# Patient Record
Sex: Male | Born: 1940
Health system: Southern US, Community
[De-identification: ages and names within clinical notes are randomized; demographics above are authoritative.]

## PROBLEM LIST (undated history)

## (undated) DIAGNOSIS — I639 Cerebral infarction, unspecified: Secondary | ICD-10-CM

## (undated) DIAGNOSIS — R251 Tremor, unspecified: Secondary | ICD-10-CM

## (undated) DIAGNOSIS — H3530 Unspecified macular degeneration: Secondary | ICD-10-CM

## (undated) DIAGNOSIS — R269 Unspecified abnormalities of gait and mobility: Secondary | ICD-10-CM

## (undated) DIAGNOSIS — R5383 Other fatigue: Secondary | ICD-10-CM

## (undated) DIAGNOSIS — I10 Essential (primary) hypertension: Secondary | ICD-10-CM

## (undated) DIAGNOSIS — M1990 Unspecified osteoarthritis, unspecified site: Secondary | ICD-10-CM

## (undated) DIAGNOSIS — C801 Malignant (primary) neoplasm, unspecified: Secondary | ICD-10-CM

## (undated) DIAGNOSIS — C61 Malignant neoplasm of prostate: Secondary | ICD-10-CM

## (undated) DIAGNOSIS — E119 Type 2 diabetes mellitus without complications: Secondary | ICD-10-CM

## (undated) DIAGNOSIS — E559 Vitamin D deficiency, unspecified: Secondary | ICD-10-CM

## (undated) DIAGNOSIS — K219 Gastro-esophageal reflux disease without esophagitis: Secondary | ICD-10-CM

## (undated) DIAGNOSIS — G20A1 Parkinson's disease without dyskinesia, without mention of fluctuations: Secondary | ICD-10-CM

## (undated) DIAGNOSIS — I219 Acute myocardial infarction, unspecified: Secondary | ICD-10-CM

## (undated) HISTORY — DX: Unspecified abnormalities of gait and mobility: R26.9

## (undated) HISTORY — PX: HEMORROIDECTOMY: SUR656

## (undated) HISTORY — PX: CARDIAC SURGERY: SHX584

## (undated) HISTORY — DX: Vitamin D deficiency, unspecified: E55.9

## (undated) HISTORY — DX: Tremor, unspecified: R25.1

## (undated) HISTORY — PX: KNEE SURGERY: SHX244

## (undated) HISTORY — DX: Other fatigue: R53.83

## (undated) HISTORY — PX: CORONARY ARTERY BYPASS GRAFT: SHX141

## (undated) HISTORY — DX: Malignant neoplasm of prostate: C61

---

## 1998-04-23 ENCOUNTER — Inpatient Hospital Stay (HOSPITAL_COMMUNITY)
Admission: AD | Admit: 1998-04-23 | Discharge: 1998-04-28 | Payer: Self-pay | Admitting: Thoracic Surgery (Cardiothoracic Vascular Surgery)

## 2004-05-12 ENCOUNTER — Ambulatory Visit (HOSPITAL_COMMUNITY): Admission: RE | Admit: 2004-05-12 | Discharge: 2004-05-12 | Payer: Self-pay | Admitting: General Surgery

## 2005-08-20 ENCOUNTER — Ambulatory Visit: Payer: Self-pay | Admitting: Orthopedic Surgery

## 2005-08-28 ENCOUNTER — Ambulatory Visit: Payer: Self-pay | Admitting: Orthopedic Surgery

## 2005-08-28 ENCOUNTER — Ambulatory Visit (HOSPITAL_COMMUNITY): Admission: RE | Admit: 2005-08-28 | Discharge: 2005-08-28 | Payer: Self-pay | Admitting: Orthopedic Surgery

## 2005-08-31 ENCOUNTER — Ambulatory Visit: Payer: Self-pay | Admitting: Orthopedic Surgery

## 2005-09-01 ENCOUNTER — Encounter (HOSPITAL_COMMUNITY): Admission: RE | Admit: 2005-09-01 | Discharge: 2005-09-16 | Payer: Self-pay | Admitting: Orthopedic Surgery

## 2005-09-23 ENCOUNTER — Ambulatory Visit: Payer: Self-pay | Admitting: Orthopedic Surgery

## 2007-04-27 ENCOUNTER — Ambulatory Visit (HOSPITAL_COMMUNITY): Admission: RE | Admit: 2007-04-27 | Discharge: 2007-04-27 | Payer: Self-pay | Admitting: General Surgery

## 2009-12-25 ENCOUNTER — Ambulatory Visit
Admission: RE | Admit: 2009-12-25 | Discharge: 2010-03-25 | Payer: Self-pay | Source: Home / Self Care | Admitting: Radiation Oncology

## 2010-02-28 ENCOUNTER — Encounter: Admission: RE | Admit: 2010-02-28 | Discharge: 2010-02-28 | Payer: Self-pay | Admitting: Urology

## 2010-04-08 ENCOUNTER — Ambulatory Visit (HOSPITAL_BASED_OUTPATIENT_CLINIC_OR_DEPARTMENT_OTHER): Admission: RE | Admit: 2010-04-08 | Discharge: 2010-04-08 | Payer: Self-pay | Admitting: Urology

## 2010-05-02 ENCOUNTER — Ambulatory Visit: Admission: RE | Admit: 2010-05-02 | Discharge: 2010-05-29 | Payer: Self-pay | Admitting: Radiation Oncology

## 2010-12-07 LAB — CBC
Hemoglobin: 13.2 g/dL (ref 13.0–17.0)
MCV: 89.7 fL (ref 78.0–100.0)
Platelets: 246 10*3/uL (ref 150–400)
RBC: 4.22 MIL/uL (ref 4.22–5.81)
RDW: 15.9 % — ABNORMAL HIGH (ref 11.5–15.5)
WBC: 4.7 10*3/uL (ref 4.0–10.5)

## 2010-12-07 LAB — COMPREHENSIVE METABOLIC PANEL
BUN: 16 mg/dL (ref 6–23)
Calcium: 9.4 mg/dL (ref 8.4–10.5)
Chloride: 107 mEq/L (ref 96–112)
Creatinine, Ser: 0.89 mg/dL (ref 0.4–1.5)
GFR calc Af Amer: >60 mL/min (ref >60)
GFR calc non Af Amer: >60 mL/min (ref >60)
Potassium: 4.2 mEq/L (ref 3.5–5.1)
Sodium: 139 mEq/L (ref 135–145)
Total Bilirubin: 0.8 mg/dL (ref 0.3–1.2)

## 2010-12-07 LAB — APTT: aPTT: 31 seconds (ref 24–37)

## 2010-12-07 LAB — PROTIME-INR: Prothrombin Time: 13.9 seconds (ref 11.6–15.2)

## 2010-12-25 ENCOUNTER — Other Ambulatory Visit: Payer: Self-pay | Admitting: General Surgery

## 2010-12-25 ENCOUNTER — Encounter (HOSPITAL_COMMUNITY): Payer: Medicare Other

## 2010-12-25 LAB — BASIC METABOLIC PANEL
BUN: 13 mg/dL (ref 6–23)
CO2: 27 mEq/L (ref 19–32)
GFR calc Af Amer: >60 mL/min (ref >60)
Glucose, Bld: 174 mg/dL — ABNORMAL HIGH (ref 70–99)

## 2010-12-25 LAB — SURGICAL PCR SCREEN: MRSA, PCR: NEGATIVE

## 2010-12-25 LAB — CBC
Hemoglobin: 13.8 g/dL (ref 13.0–17.0)
MCH: 31.3 pg (ref 26.0–34.0)
MCHC: 34.1 g/dL (ref 30.0–36.0)
Platelets: 232 10*3/uL (ref 150–400)

## 2010-12-29 ENCOUNTER — Ambulatory Visit (HOSPITAL_COMMUNITY)
Admission: RE | Admit: 2010-12-29 | Discharge: 2010-12-29 | Disposition: A | Discharge status: Home / Self Care | Payer: Medicare Other | Source: Ambulatory Visit | Attending: General Surgery | Admitting: General Surgery

## 2010-12-29 ENCOUNTER — Other Ambulatory Visit: Payer: Self-pay | Admitting: General Surgery

## 2010-12-29 DIAGNOSIS — K602 Anal fissure, unspecified: Secondary | ICD-10-CM | POA: Insufficient documentation

## 2010-12-29 DIAGNOSIS — K644 Residual hemorrhoidal skin tags: Secondary | ICD-10-CM | POA: Insufficient documentation

## 2010-12-29 DIAGNOSIS — I10 Essential (primary) hypertension: Secondary | ICD-10-CM | POA: Insufficient documentation

## 2010-12-29 DIAGNOSIS — Z7982 Long term (current) use of aspirin: Secondary | ICD-10-CM | POA: Insufficient documentation

## 2010-12-29 DIAGNOSIS — Z79899 Other long term (current) drug therapy: Secondary | ICD-10-CM | POA: Insufficient documentation

## 2010-12-29 DIAGNOSIS — K648 Other hemorrhoids: Secondary | ICD-10-CM | POA: Insufficient documentation

## 2011-01-01 NOTE — H&P (Signed)
  [name], [name]               ACCOUNT NO.:  [id number]  MEDICAL RECORD NO.:  [id number]          PATIENT TYPE:  LOCATION:                                 FACILITY:  PHYSICIAN:  Dalia Heading, M.D.  DATE OF BIRTH:  [birth year]  DATE OF ADMISSION: DATE OF DISCHARGE:  LH                             HISTORY & PHYSICAL   CHIEF COMPLAINT:  Rectal pain.  HISTORY OF PRESENT ILLNESS:  The patient is a 70 year old white male, who is referred for evaluation and treatment of rectal pain.  He has been present intermittently for 1 year.  This occurred soon after having radiation and seed implantation for prostate cancer.  He has had recent increasing discomfort.  Suppositories have not been helpful.  He last had a colonoscopy approximately 10 years ago.  He denies any significant rectal bleeding.  Suppositories have not been helpful.  PAST MEDICAL HISTORY:  As noted above, coronary artery disease, hypertension.  PAST SURGICAL HISTORY:  Right knee surgery, CABG, radiation, seed implantation in July 2011.  CURRENT MEDICATIONS:  Pravastatin 20 mg p.o. daily, enalapril 20 mg p.o. daily, baby aspirin which he is holding.  ALLERGIES:  No known drug allergies.  REVIEW OF SYSTEMS:  The patient denies drinking or smoking.  He denies any other cardiopulmonary difficulties or bleeding disorders.  FAMILY MEDICAL HISTORY:  Noncontributory.  PHYSICAL EXAMINATION:  GENERAL:  The patient is a well-developed, well- nourished white male, in no acute distress. HEENT:  Unremarkable. LUNGS:  Clear to auscultation with equal breath sounds bilaterally. HEART:  Reveals regular rate and rhythm without S3, S4, or murmurs. ABDOMEN:  Soft, nontender, nondistended.  No hepatosplenomegaly, masses, hernias identified. RECTAL:  Reveals mild internal hemorrhoidal disease, but no external hemorrhoids noted, and fissure is noted anteriorly.  A tight internal sphincter is noted.  IMPRESSION:  Anal  fissure.  PLAN:  The patient was scheduled for an anal fissurectomy with sphincterotomy on December 29, 2010.  Risks and benefits of the procedure including bleeding, recurrence, and incontinence were fully explained to the patient, gave informed consent.     Dalia Heading, M.D.     MAJ/MEDQ  D:  12/25/2010  T:  12/26/2010  Job:  [id number]  cc:   Corrie Mckusick, M.D. Fax: [phone]  Electronically Signed by Franky Macho M.D. on 01/01/2011 07:54:05 AM

## 2011-01-01 NOTE — Op Note (Signed)
  NAMEJESSEN, [name]               ACCOUNT NO.:  [id number]  MEDICAL RECORD NO.:  [id number]           PATIENT TYPE:  O  LOCATION:  DAYP                          FACILITY:  [facility]  PHYSICIAN:  Dalia Heading, M.D.  DATE OF BIRTH:  09/06/1941  DATE OF PROCEDURE:  12/29/2010 DATE OF DISCHARGE:                              OPERATIVE REPORT   PREOPERATIVE DIAGNOSIS:  Anal fissure.  POSTOPERATIVE DIAGNOSES:  Anal fissure, extensive internal and external hemorrhoids.  PROCEDURE:  Extensive hemorrhoidectomy.  SURGEON:  Dalia Heading, MD  ANESTHESIA:  General.  INDICATIONS:  The patient is a 70 year old white male who presents with rectal pain and a possible anterior anal fissure.  He has had radiation seed implanted in the last year for prostate cancer.  The risks and benefits of the procedure including bleeding, infection, recurrence of the pain and hemorrhoids were fully explained to the patient, gave informed consent.  PROCEDURE NOTE:  The patient was placed in lithotomy position after general anesthesia was administered.  The perineum was prepped and draped using the usual sterile technique with Betadine.  Surgical site confirmation was performed.  On anoscopy, the patient had minimal tear in the anterior portion of the rectum.  This was fulgurated without difficulty.  The patient had extensive internal hemorrhoids extending to the external hemorrhoidal region along the 8 o'clock position.  Minimal external hemorrhoids were noted at the 4 o'clock position.  Both internal hemorrhoids were noted at the 4 o'clock and 8 o'clock positions.  The internal hemorrhoid at the 4 o'clock position was removed using the LigaSure.  The external and internal hemorrhoids at the 8 o'clock position were removed using the LigaSure.  At the anal verge at the 8 o'clock position, several 2-0 Vicryl sutures were placed to help reapproximating the mucosa in this region.  There did not appear to be  a need for a sphincterotomy as the internal sphincter was well relaxed and allowed two fingers into the rectum.  0.5% Sensorcaine was instilled in the surrounding perineum. Surgicel and viscous Xylocaine packing were then placed.  All tape and needle counts were correct at the end of the procedure. The patient was awakened and transferred to the recovery room awake in stable condition.  COMPLICATIONS:  None.  SPECIMEN:  Hemorrhoids.  BLOOD LOSS:  Minimal.     Dalia Heading, M.D.     MAJ/MEDQ  D:  12/29/2010  T:  12/30/2010  Job:  [id number]  Electronically Signed by Franky Macho M.D. on 01/01/2011 07:54:03 AM

## 2011-02-06 NOTE — Procedures (Signed)
[name], [name]               ACCOUNT NO.:  [id number]   MEDICAL RECORD NO.:  [id number]          PATIENT TYPE:  AMB   LOCATION:  DAY                           FACILITY:  [facility]   PHYSICIAN:  Edward L. Juanetta Gosling, M.D.DATE OF BIRTH:  [birth year]   DATE OF PROCEDURE:  08/26/2005  DATE OF DISCHARGE:                                EKG INTERPRETATION   Time 10:24, August 26, 2005. The rhythm is sinus rhythm with a rate in the  60s. The R wave in V2 is much taller than the R wave in V3, and I suspect  there may have been transpositions of these leads. This could also have been  secondary to an anterior infarction, but there is excellent voltage in V4  and V5, and I suspect the most likely problem is transposition of leads V2  and V3. Otherwise normal electrocardiogram.      Edward L. Juanetta Gosling, M.D.  Electronically Signed     ELH/MEDQ  D:  08/26/2005  T:  08/27/2005  Job:  [id number]

## 2011-02-06 NOTE — H&P (Signed)
NAMECOOLIDGE, [name]               ACCOUNT NO.:  [id number]   MEDICAL RECORD NO.:  [id number]          PATIENT TYPE:  AMB   LOCATION:  DAY                           FACILITY:  [facility]   PHYSICIAN:  Vickki Hearing, M.D.DATE OF BIRTH:  [birth year]   DATE OF ADMISSION:  DATE OF DISCHARGE:  LH                                HISTORY & PHYSICAL   CHIEF COMPLAINT:  Right knee pain.   He is 70 years old. He has had pain on and off in his right knee for six to  eight months. He got one cortisone injection from Dr. Sherwood Gambler, and initially  that helped. He has continued to have pain, however, and eventually went for  a MRI at [facility]. He was shown to have advanced medial  compartment osteoarthritis, stress reaction of the medial condyle, and a  chronically torn and fragmented medial meniscus with effusion and Baker's  cyst due to mechanical symptoms. He presents for arthroscopic surgery of the  right knee.   Review of systems, past, family and social history show no allergies. He had  coronary artery bypass surgery. He takes Lipitor and aspirin. Has coronary  artery disease. Family history of cancer. He is married. He is a Glass blower/designer. Does not smoke or drink. Caffeine used is only a morning cup of  coffee, and his grade level is grade 12.   REVIEW OF SYSTEMS:  Normal for all 10 systems.   PHYSICAL EXAMINATION:  VITAL SIGNS:  Weight 200, pulse 74, respiratory rate  18.  GENERAL APPEARANCE:  Normal development, grooming, hygiene and nutrition.  PSYCH:  Alert and oriented x3. Mood normal.  NEUROLOGICAL:  Sensation normal. Coordination normal. Reflexes normal.  LYMPH NODES:  Normal.  SKIN:  Normal.  CARDIOVASCULAR/PERIPHERAL VASCULAR:  Good pulse and perfusion. No edema and  no swelling. No venous stasis.  MUSCULOSKELETAL:  Gait and station showed fairly normal gait pattern. His  right lower extremity range of motion was limited I think secondary to  arthritis. Range of  motion was approximately 5 to 125 degrees. He had normal  muscle strength and tone with no atrophy or tremor. There was good stability  in all the ligaments. His medial joint line was tender including the medial  femoral condyle and medial tibial plateau, and he did have meniscal signs.  His opposite extremity was unremarkable. His upper extremities had normal  alignment. No contraction, atrophy or subluxation.   STUDIES:  MRI shows the findings as I stated.   DIAGNOSIS:  Osteoarthritis and torn medial meniscus, right knee.   PLAN:  Arthroscopic medial meniscectomy and evaluation of the joint. The  patient has consented to this surgery.      Vickki Hearing, M.D.  Electronically Signed     SEH/MEDQ  D:  08/27/2005  T:  08/27/2005  Job:  [id number]   cc:   Madelin Rear. Sherwood Gambler, MD  Fax: [phone]

## 2011-02-06 NOTE — H&P (Signed)
NAME:  Luiscarlos, [name] NO.:  [id number]   MEDICAL RECORD NO.:  [id number]                   PATIENT TYPE:   LOCATION:                                       FACILITY:   PHYSICIAN:  Dalia Heading, M.D.               DATE OF BIRTH:  [birth year]   DATE OF ADMISSION:  DATE OF DISCHARGE:                                HISTORY & PHYSICAL   CHIEF COMPLAINT:  Need for screening colonoscopy.   HISTORY OF PRESENT ILLNESS:  The patient is a 70 year old white male who is  referred for endoscopic evaluation.  He needs a colonoscopy for screening  purposes.  No abdominal pain, weight loss, nausea, vomiting, diarrhea,  constipation, melena, or hematochezia have been noted.  He has never had a  colonoscopy.  There is no family history of colon carcinoma.   PAST MEDICAL HISTORY:  Coronary artery disease.   PAST SURGICAL HISTORY:  CABG in 1999.   CURRENT MEDICATIONS:  Baby aspirin.   ALLERGIES:  No known drug allergies.   REVIEW OF SYSTEMS:  Noncontributory.   PHYSICAL EXAMINATION:  GENERAL:  The patient is a well-developed, well-  nourished white male in no acute distress.  VITAL SIGNS:  He is afebrile and vital signs are stable.  LUNGS:  Clear to auscultation with equal breath sounds bilaterally.  HEART:  Regular rate and rhythm without S3, S4, or murmurs.  ABDOMEN:  Soft, nontender, nondistended.  No hepatosplenomegaly or masses  are noted.  RECTAL:  Deferred to the procedure.   IMPRESSION:  Need for screening colonoscopy.   PLAN:  The patient is scheduled for a colonoscopy on May 12, 2004.  The  risks and benefits of the procedure, including bleeding and perforation were  fully explained to the patient, who gave informed consent.     ___________________________________________                                         Dalia Heading, M.D.   MAJ/MEDQ  D:  05/01/2004  T:  05/01/2004  Job:  [id number]   cc:   Madelin Rear. Sherwood Gambler, M.D.  [address]  [place]  [place] [address]  Fax: [phone]

## 2011-02-06 NOTE — Op Note (Signed)
NAMEHEATON, SARIN               ACCOUNT NO.:  [id number]   MEDICAL RECORD NO.:  [id number]          PATIENT TYPE:  AMB   LOCATION:  DAY                           FACILITY:  [facility]   PHYSICIAN:  Vickki Hearing, M.D.DATE OF BIRTH:  21-May-1941   DATE OF PROCEDURE:  08/28/2005  DATE OF DISCHARGE:                                 OPERATIVE REPORT   PREOPERATIVE DIAGNOSIS:  Torn medial meniscus and osteoarthritis of the  right knee.   POSTOPERATIVE DIAGNOSIS:  Torn medial meniscus and osteoarthritis of the  right knee.   PROCEDURE:  Arthroscopy right knee, medial meniscectomy, medial femoral  condyle chondroplasty.   SURGEON:  Dr. [place] Apple. No assistants.   ANESTHETIC:  General.   SPECIMENS:  No specimens.   BLOOD LOSS:  Minimal.   COMPLICATIONS:  None. Counts were correct at end of procedure. The patient  went to PACU in good condition.   Mr. [name] was identified in the preoperative holding area. His right  knee was marked for surgery, countersigned by the surgeon. History and  physical was updated. Antibiotics were started. He was taken to the  operating room. General anesthesia was administered in the supine position.  His right leg was prepped and draped using sterile technique using DuraPrep.   A time-out was taken. Procedure was confirmed. The knee was entered through  a lateral portal. A diagnostic arthroscopy was done arthroscopic findings  included the following:  There was generalized arthritis of the medial  compartment over the medial femoral condyle with a grade 2 chondral lesion  in terms of depth, grade 3 in terms of overall width. There was a torn  medial meniscus at its posterior horn.   Remaining structures of the knee were normal, except for the mild  chondromalacia of patella, grade 1.   Using a combination of arthroscopic instruments, duckbill shaver and  arthroscopic ArthroCare wand, the meniscal tear was resected, balanced and  the  meniscal fragments were removed from the knee via suction. Knee was  washed and irrigated and then closed with half inch Steri-Strips and  injected with 20 cc of Marcaine with epinephrine.   Sterile dressings were applied and a CryoCuff was added. The patient was  extubated and taken to recovery room in stable condition.   POSTOPERATIVE PLAN:  Physical therapy on Tuesday. He can be discharged to  home and follow with me on Monday. He has Lorcet Plus 1 every 4 hours #60  with 2 refills. He is given some Phenergan 25 mg every 6 hours p.r.n. nausea  #30 with 1 refill.      Vickki Hearing, M.D.  Electronically Signed     SEH/MEDQ  D:  08/28/2005  T:  08/28/2005  Job:  [id number]

## 2011-08-25 ENCOUNTER — Encounter: Payer: Self-pay | Admitting: *Deleted

## 2011-08-25 NOTE — Progress Notes (Signed)
IPPIS results on 12/25/09= 0,2,0,0,0,0,1=3

## 2016-02-22 NOTE — Progress Notes (Signed)
Patient ID: [name], male   DOB: 12/21/1940, 75 y.o.   MRN: [id number]     Cardiology Office Note   Date:  02/25/2016   ID:  [name], DOB Nov 11, 1940, MRN [id number]  PCP:  Glo Herring., MD  Cardiologist:   Jenkins Rouge, MD   No chief complaint on file.     History of Present Illness: [name] is a 75 y.o. male who presents for post hospital f/u.  Admitted [facility] 5/2-01/24/16  Acute Bronchitis , Dyspnea ? CHF and abnormal ECG.   CTA with no PE prior granulomatours disease.  Sent  Home on prednisone taper.  CRF;s HTN and elevated lipids Had MI in [place] to [place].  Cath Dr Ubaldo Glassing and transferred to [facility] for CABG with Dr Servando Snare.  Details of surgery not available in Epic.  Saw Dr Ubaldo Glassing For a year or two post CABG but then just f/u with Dr Gerarda Fraction.  Since being hospitalized still with more exertional dyspnea.  No chest pain.  No edema palpitations or syncope. Compliant with meds. 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 Spends winters in [place]. Living in motor home [place] now.      No past medical history on file.  No past surgical history on file.   Current Outpatient Prescriptions  Medication Sig Dispense Refill  . amLODipine (NORVASC) 5 MG tablet Take 5 mg by mouth daily.    Marland Kitchen aspirin EC 81 MG tablet Take 81 mg by mouth daily.    . enalapril (VASOTEC) 20 MG tablet Take 20 mg by mouth daily.    . pravastatin (PRAVACHOL) 20 MG tablet Take 20 mg by mouth daily.    . metFORMIN (GLUCOPHAGE-XR) 500 MG 24 hr tablet Take 500 mg by mouth daily with breakfast. 2 tabs daily     No current facility-administered medications for this visit.    Allergies:   Review of patient's allergies indicates no known allergies.    Social History:  The patient  reports that he quit smoking about 57 years ago. His smoking use included Cigarettes. He smoked 1.00 pack per day. He has  quit using smokeless tobacco. His smokeless tobacco use included Snuff.   Family History:  The patient's family history is not on file.    ROS:  Please see the history of present illness.   Otherwise, review of systems are positive for none.   All other systems are reviewed and negative.    PHYSICAL EXAM: VS:  BP 124/78 mmHg  Pulse 95  Ht 5\' 9"  (1.753 m)  Wt 87.544 kg (193 lb)  BMI 28.49 kg/m2  SpO2 99% , BMI Body mass index is 28.49 kg/(m^2). Affect appropriate Healthy:  appears stated age 75: normal Neck supple with no adenopathy JVP normal no bruits no thyromegaly Lungs clear with no wheezing and good diaphragmatic motion Heart:  S1/S2 no murmur, no rub, gallop or click previous sternotomy PMI normal Abdomen: benighn, BS positve, no tenderness, no AAA no bruit.  No HSM or HJR Distal pulses intact with no bruits No edema Neuro non-focal Skin warm and dry No muscular weakness    EKG:   02/25/16  NSR normal ECG    Recent Labs: No results found for requested labs within last 365 days.    Lipid Panel No results found for: CHOL, TRIG, HDL, CHOLHDL, VLDL, LDLCALC, LDLDIRECT    Wt Readings from Last  3 Encounters:  02/25/16 87.544 kg (193 lb)      Other studies Reviewed: Additional studies/ records that were reviewed today include: Dr Leamon Arnt records and notes from [facility] admission .    ASSESSMENT AND PLAN:  1.  CAD/CABG:  With more fatigue and exertional dypsnea. F/U exercise myovue given 75 yo grafts 2. Dysonea:  ? Related to recent URI/Bronchitis not wheezing done with steroid taper and nebulizer f/u echo to assess RV/LV function 3. BP:  Well controlled.  Continue current medications and low sodium Dash type diet.   4 Chol: on statin labs with primary  5. DM:  Discussed low carb diet.  Target hemoglobin A1c is 6.5 or less.  Continue current medications. Hold metfomin for myovue study    Current medicines are reviewed at length with the patient today.   The patient does not have concerns regarding medicines.  The following changes have been made:  no change  Labs/ tests ordered today include: Ex Myovue and Echo   Orders Placed This Encounter  Procedures  . EKG 12-Lead     Disposition:   FU with me in a year      Signed, Jenkins Rouge, MD  02/25/2016 2:35 PM    [facility] [place], [place], [place]  [address] Phone: [phone]; Fax: [phone]

## 2016-02-24 DIAGNOSIS — E119 Type 2 diabetes mellitus without complications: Secondary | ICD-10-CM | POA: Insufficient documentation

## 2016-02-24 DIAGNOSIS — E782 Mixed hyperlipidemia: Secondary | ICD-10-CM | POA: Insufficient documentation

## 2016-02-24 DIAGNOSIS — I10 Essential (primary) hypertension: Secondary | ICD-10-CM | POA: Insufficient documentation

## 2016-02-24 DIAGNOSIS — Z6829 Body mass index (BMI) 29.0-29.9, adult: Secondary | ICD-10-CM

## 2016-02-25 ENCOUNTER — Encounter: Payer: Self-pay | Admitting: Cardiovascular Disease

## 2016-02-25 ENCOUNTER — Ambulatory Visit (INDEPENDENT_AMBULATORY_CARE_PROVIDER_SITE_OTHER): Payer: Medicare Other | Admitting: Cardiovascular Disease

## 2016-02-25 VITALS — BP 124/78 | HR 95 | Ht 69.0 in | Wt 193.0 lb

## 2016-02-25 DIAGNOSIS — R072 Precordial pain: Secondary | ICD-10-CM

## 2016-02-25 DIAGNOSIS — R0600 Dyspnea, unspecified: Secondary | ICD-10-CM

## 2016-02-25 NOTE — Patient Instructions (Addendum)
Your physician wants you to follow-up in: 1 year with Dr Johnsie Cancel in [facility] will receive a reminder letter in the mail two months in advance. If you don't receive a letter, please call our office to schedule the follow-up appointment.    Your physician recommends that you continue on your current medications as directed. Please refer to the Current Medication list given to you today.   Your physician has requested that you have an echocardiogram. Echocardiography is a painless test that uses sound waves to create images of your heart. It provides your doctor with information about the size and shape of your heart and how well your heart's chambers and valves are working. This procedure takes approximately one hour. There are no restrictions for this procedure.   Your physician has requested that you have en exercise stress myoview. For further information please visit [email or web address]. Please follow instruction sheet, as given.HOLD METFORMIN AM OF TEST     If you need a refill on your cardiac medications before your next appointment, please call your pharmacy.      Thank you for choosing [facility] !

## 2016-03-05 ENCOUNTER — Encounter (HOSPITAL_COMMUNITY): Payer: Medicare Other

## 2016-03-05 ENCOUNTER — Encounter (HOSPITAL_COMMUNITY)
Admission: RE | Admit: 2016-03-05 | Discharge: 2016-03-05 | Disposition: A | Discharge status: Home / Self Care | Payer: Medicare Other | Source: Ambulatory Visit | Attending: Cardiovascular Disease | Admitting: Cardiovascular Disease

## 2016-03-05 ENCOUNTER — Encounter (HOSPITAL_COMMUNITY): Payer: Self-pay

## 2016-03-05 ENCOUNTER — Ambulatory Visit (HOSPITAL_COMMUNITY)
Admission: RE | Admit: 2016-03-05 | Discharge: 2016-03-05 | Disposition: A | Discharge status: Home / Self Care | Payer: Medicare Other | Source: Ambulatory Visit | Attending: Cardiovascular Disease | Admitting: Cardiovascular Disease

## 2016-03-05 ENCOUNTER — Inpatient Hospital Stay (HOSPITAL_COMMUNITY): Admission: RE | Admit: 2016-03-05 | Payer: Medicare Other | Source: Ambulatory Visit

## 2016-03-05 DIAGNOSIS — R0600 Dyspnea, unspecified: Secondary | ICD-10-CM | POA: Insufficient documentation

## 2016-03-05 DIAGNOSIS — Z951 Presence of aortocoronary bypass graft: Secondary | ICD-10-CM | POA: Insufficient documentation

## 2016-03-05 DIAGNOSIS — R072 Precordial pain: Secondary | ICD-10-CM | POA: Diagnosis not present

## 2016-03-05 DIAGNOSIS — E785 Hyperlipidemia, unspecified: Secondary | ICD-10-CM | POA: Diagnosis not present

## 2016-03-05 DIAGNOSIS — R079 Chest pain, unspecified: Secondary | ICD-10-CM

## 2016-03-05 DIAGNOSIS — I119 Hypertensive heart disease without heart failure: Secondary | ICD-10-CM | POA: Diagnosis not present

## 2016-03-05 DIAGNOSIS — E119 Type 2 diabetes mellitus without complications: Secondary | ICD-10-CM | POA: Insufficient documentation

## 2016-03-05 HISTORY — DX: Malignant (primary) neoplasm, unspecified: C80.1

## 2016-03-05 LAB — ECHOCARDIOGRAM COMPLETE
CHL CUP MV DEC (S): 433
CHL CUP STROKE VOLUME: 49 mL
CHL CUP TV REG PEAK VELOCITY: 227 cm/s
E decel time: 433 msec
EERAT: 5.72
FS: 32 % (ref 28–44)
IVS/LV PW RATIO, ED: 1.07
LA ID, A-P, ES: 45 mm
LA diam end sys: 45 mm
LA diam index: 2.22 cm/m2
LA vol A4C: 49.4 ml
LA vol: 58.4 mL
LAVOLIN: 28.8 mL/m2
LDCA: 4.52 cm2
LV E/e' medial: 5.72
LV E/e'average: 5.72
LV PW d: 11.8 mm — AB (ref 0.6–1.1)
LV SIMPSON'S DISK: 59
LV TDI E'MEDIAL: 5.66
LV dias vol: 83 mL (ref 62–150)
LV sys vol index: 17 mL/m2
LV sys vol: 34 mL (ref 21–61)
LVDIAVOLIN: 41 mL/m2
LVELAT: 10 cm/s
LVOTD: 24 mm
MV pk A vel: 114 m/s
MVPKEVEL: 57.2 m/s
TAPSE: 15.5 mm
TDI e' lateral: 10
TRMAXVEL: 227 cm/s

## 2016-03-05 LAB — NM MYOCAR MULTI W/SPECT W/WALL MOTION / EF
CHL CUP NUCLEAR SDS: 3
CHL CUP NUCLEAR SRS: 0
CHL CUP RESTING HR STRESS: 50 {beats}/min
CHL RATE OF PERCEIVED EXERTION: 14
CSEPEDS: 1 s
CSEPEW: 7 METS
CSEPPHR: 129 {beats}/min
Exercise duration (min): 5 min
LHR: 0.25
LV dias vol: 101 mL (ref 62–150)
LV sys vol: 45 mL
MPHR: 145 {beats}/min
Percent HR: 88 %
SSS: 3
TID: 0.89

## 2016-03-05 MED ORDER — TECHNETIUM TC 99M TETROFOSMIN IV KIT
10.0000 | PACK | Freq: Once | INTRAVENOUS | Status: AC | PRN
  Administered 2016-03-05: 11 via INTRAVENOUS

## 2016-03-05 MED ORDER — REGADENOSON 0.4 MG/5ML IV SOLN
INTRAVENOUS | Status: AC
  Filled 2016-03-05: qty 5

## 2016-03-05 MED ORDER — TECHNETIUM TC 99M TETROFOSMIN IV KIT
30.0000 | PACK | Freq: Once | INTRAVENOUS | Status: AC | PRN
  Administered 2016-03-05: 30 via INTRAVENOUS

## 2016-03-05 MED ORDER — SODIUM CHLORIDE 0.9% FLUSH
INTRAVENOUS | Status: AC
  Administered 2016-03-05: 10 mL via INTRAVENOUS
  Filled 2016-03-05: qty 10

## 2016-04-07 ENCOUNTER — Other Ambulatory Visit (HOSPITAL_COMMUNITY): Payer: Self-pay | Admitting: Respiratory Therapy

## 2016-04-07 DIAGNOSIS — R05 Cough: Secondary | ICD-10-CM

## 2016-04-07 DIAGNOSIS — R059 Cough, unspecified: Secondary | ICD-10-CM

## 2016-04-15 ENCOUNTER — Ambulatory Visit (HOSPITAL_COMMUNITY)
Admission: RE | Admit: 2016-04-15 | Discharge: 2016-04-15 | Disposition: A | Discharge status: Home / Self Care | Payer: Medicare Other | Source: Ambulatory Visit | Attending: Pulmonary Disease | Admitting: Pulmonary Disease

## 2016-04-15 DIAGNOSIS — R05 Cough: Secondary | ICD-10-CM | POA: Diagnosis present

## 2016-04-15 MED ORDER — ALBUTEROL SULFATE (2.5 MG/3ML) 0.083% IN NEBU
2.5000 mg | INHALATION_SOLUTION | Freq: Once | RESPIRATORY_TRACT | Status: AC
  Administered 2016-04-15: 2.5 mg via RESPIRATORY_TRACT

## 2016-04-17 LAB — PULMONARY FUNCTION TEST
DL/VA % PRED: 92 %
DL/VA: 4.2 ml/min/mmHg/L
DLCO UNC % PRED: 90 %
DLCO UNC: 28.11 ml/min/mmHg
FEF 25-75 POST: 3.58 L/s
FEF 25-75 PRE: 3.33 L/s
FEF2575-%CHANGE-POST: 7 %
FEF2575-%PRED-POST: 169 %
FEF2575-%Pred-Pre: 157 %
FEV1-%CHANGE-POST: 1 %
FEV1-%Pred-Post: 91 %
FEV1-%Pred-Pre: 90 %
FEV1-POST: 2.67 L
FEV1-Pre: 2.64 L
FEV1FVC-%Change-Post: -3 %
FEV1FVC-%PRED-PRE: 119 %
FEV6-%Change-Post: 5 %
FEV6-%PRED-POST: 84 %
FEV6-%Pred-Pre: 80 %
FEV6-POST: 3.22 L
FEV6-Pre: 3.07 L
FEV6FVC-%PRED-POST: 106 %
FEV6FVC-%Pred-Pre: 106 %
FVC-%CHANGE-POST: 5 %
FVC-%PRED-POST: 79 %
FVC-%Pred-Pre: 75 %
FVC-PRE: 3.07 L
FVC-Post: 3.22 L
POST FEV1/FVC RATIO: 83 %
PRE FEV1/FVC RATIO: 86 %
PRE FEV6/FVC RATIO: 100 %
Post FEV6/FVC ratio: 100 %
RV % PRED: 129 %
RV: 3.25 L
TLC % pred: 96 %
TLC: 6.57 L

## 2017-01-07 ENCOUNTER — Ambulatory Visit (INDEPENDENT_AMBULATORY_CARE_PROVIDER_SITE_OTHER): Payer: Medicare Other | Admitting: General Surgery

## 2017-01-07 ENCOUNTER — Encounter: Payer: Self-pay | Admitting: General Surgery

## 2017-01-07 VITALS — BP 167/71 | HR 88 | Temp 98.4°F | Resp 18 | Ht 69.0 in | Wt 202.0 lb

## 2017-01-07 DIAGNOSIS — Z1211 Encounter for screening for malignant neoplasm of colon: Secondary | ICD-10-CM | POA: Diagnosis not present

## 2017-01-07 MED ORDER — PEG 3350-KCL-NABCB-NACL-NASULF 236 G PO SOLR: 4000.0000 mL | Freq: Once | ORAL | 0 refills | Status: AC

## 2017-01-07 NOTE — Progress Notes (Signed)
[name]; [id number]; Jan 21, 1941   HPI   Patient is a 76 year old white male who is referred by care for screening colonoscopy.  He denies any rectal pain, diarrhea, constipation, family history of colon cancer, or blood per rectum.  He has been treated for prostate cancer with radiation seeds. Past Medical History:  Diagnosis Date  . Cancer ([place])     No past surgical history on file.  No family history on file.  Current Outpatient Prescriptions on File Prior to Visit  Medication Sig Dispense Refill  . amLODipine (NORVASC) 5 MG tablet Take 5 mg by mouth daily.    Marland Kitchen aspirin EC 81 MG tablet Take 81 mg by mouth daily.    . enalapril (VASOTEC) 20 MG tablet Take 20 mg by mouth daily.    . metFORMIN (GLUCOPHAGE-XR) 500 MG 24 hr tablet Take 500 mg by mouth daily with breakfast. 2 tabs daily    . pravastatin (PRAVACHOL) 20 MG tablet Take 20 mg by mouth daily.     No current facility-administered medications on file prior to visit.     No Known Allergies  History  Alcohol use Not on file    History  Smoking Status  . Former Smoker  . Packs/day: 1.00  . Types: Cigarettes  . Quit date: 02/25/1959  Smokeless Tobacco  . Former Systems developer  . Types: Snuff    Review of Systems  Constitutional: Negative.   HENT: Negative.   Eyes: Negative.   Respiratory: Negative.   Cardiovascular: Negative.   Gastrointestinal: Negative.   Genitourinary: Negative.   Musculoskeletal: Positive for joint pain.  Skin: Negative.   Neurological: Negative.   Endo/Heme/Allergies: Negative.   Psychiatric/Behavioral: Negative.     Objective   Vitals:   01/07/17 1116  BP: (!) 167/71  Pulse: 88  Resp: 18  Temp: 98.4 F (36.9 C)    Physical Exam  Constitutional: He is oriented to person, place, and time and well-developed, well-nourished, and in no distress.  HENT:  Head: Normocephalic and atraumatic.  Neck: Normal range of motion. Neck supple.  Cardiovascular: Normal rate, regular rhythm and  normal heart sounds.   No murmur heard. Pulmonary/Chest: Effort normal and breath sounds normal. He has no wheezes. He has no rales.  Abdominal: Soft. Bowel sounds are normal. He exhibits no distension. There is no tenderness.  Neurological: He is alert and oriented to person, place, and time.  Skin: Skin is warm and dry.  Vitals reviewed.   Assessment    Need for screening colonoscopy Plan    patient is scheduled for a screening colonoscopy on 02/09/2017.  The risks and benefits of the procedure including bleeding and perforation were fully explained to the patient, who gave informed consent.  GoLYTELY has been prescribed.  The patient realizes that that this may be his last need for a screening colonoscopy.  He is concerned that given his history of prostate cancer, he would like an examination.

## 2017-01-07 NOTE — Patient Instructions (Signed)
Colonoscopy, Adult A colonoscopy is an exam to look at the entire large intestine. During the exam, a lubricated, bendable tube is inserted into the anus and then passed into the rectum, colon, and other parts of the large intestine. A colonoscopy is often done as a part of normal colorectal screening or in response to certain symptoms, such as anemia, persistent diarrhea, abdominal pain, and blood in the stool. The exam can help screen for and diagnose medical problems, including:  Tumors.  Polyps.  Inflammation.  Areas of bleeding. Tell a health care provider about:  Any allergies you have.  All medicines you are taking, including vitamins, herbs, eye drops, creams, and over-the-counter medicines.  Any problems you or family members have had with anesthetic medicines.  Any blood disorders you have.  Any surgeries you have had.  Any medical conditions you have.  Any problems you have had passing stool. What are the risks? Generally, this is a safe procedure. However, problems may occur, including:  Bleeding.  A tear in the intestine.  A reaction to medicines given during the exam.  Infection (rare). What happens before the procedure? Eating and drinking restrictions  Follow instructions from your health care provider about eating and drinking, which may include:  A few days before the procedure - follow a low-fiber diet. Avoid nuts, seeds, dried fruit, raw fruits, and vegetables.  1-3 days before the procedure - follow a clear liquid diet. Drink only clear liquids, such as clear broth or bouillon, black coffee or tea, clear juice, clear soft drinks or sports drinks, gelatin dessert, and popsicles. Avoid any liquids that contain red or purple dye.  On the day of the procedure - do not eat or drink anything during the 2 hours before the procedure, or within the time period that your health care provider recommends. Bowel prep  If you were prescribed an oral bowel prep to  clean out your colon:  Take it as told by your health care provider. Starting the day before your procedure, you will need to drink a large amount of medicated liquid. The liquid will cause you to have multiple loose stools until your stool is almost clear or light green.  If your skin or anus gets irritated from diarrhea, you may use these to relieve the irritation:  Medicated wipes, such as adult wet wipes with aloe and vitamin E.  A skin soothing-product like petroleum jelly.  If you vomit while drinking the bowel prep, take a break for up to 60 minutes and then begin the bowel prep again. If vomiting continues and you cannot take the bowel prep without vomiting, call your health care provider. General instructions   Ask your health care provider about changing or stopping your regular medicines. This is especially important if you are taking diabetes medicines or blood thinners.  Plan to have someone take you home from the hospital or clinic. What happens during the procedure?  An IV tube may be inserted into one of your veins.  You will be given medicine to help you relax (sedative).  To reduce your risk of infection:  Your health care team will wash or sanitize their hands.  Your anal area will be washed with soap.  You will be asked to lie on your side with your knees bent.  Your health care provider will lubricate a long, thin, flexible tube. The tube will have a camera and a light on the end.  The tube will be inserted into your anus.    The tube will be gently eased through your rectum and colon.  Air will be delivered into your colon to keep it open. You may feel some pressure or cramping.  The camera will be used to take images during the procedure.  A small tissue sample may be removed from your body to be examined under a microscope (biopsy). If any potential problems are found, the tissue will be sent to a lab for testing.  If small polyps are found, your health  care provider may remove them and have them checked for cancer cells.  The tube that was inserted into your anus will be slowly removed. The procedure may vary among health care providers and hospitals. What happens after the procedure?  Your blood pressure, heart rate, breathing rate, and blood oxygen level will be monitored until the medicines you were given have worn off.  Do not drive for 24 hours after the exam.  You may have a small amount of blood in your stool.  You may pass gas and have mild abdominal cramping or bloating due to the air that was used to inflate your colon during the exam.  It is up to you to get the results of your procedure. Ask your health care provider, or the department performing the procedure, when your results will be ready. This information is not intended to replace advice given to you by your health care provider. Make sure you discuss any questions you have with your health care provider. Document Released: 09/04/2000 Document Revised: 07/08/2016 Document Reviewed: 11/19/2015 Elsevier Interactive Patient Education  2017 [employer]  

## 2017-01-14 NOTE — H&P (Signed)
[name]; [id number]; [birth year]   HPI   Patient is a 76 year old white male who is referred by care for screening colonoscopy.  He denies any rectal pain, diarrhea, constipation, family history of colon cancer, or blood per rectum.  He has been treated for prostate cancer with radiation seeds.     Past Medical History:  Diagnosis Date  . Cancer ([place])     No past surgical history on file.  No family history on file.        Current Outpatient Prescriptions on File Prior to Visit  Medication Sig Dispense Refill  . amLODipine (NORVASC) 5 MG tablet Take 5 mg by mouth daily.    Marland Kitchen aspirin EC 81 MG tablet Take 81 mg by mouth daily.    . enalapril (VASOTEC) 20 MG tablet Take 20 mg by mouth daily.    . metFORMIN (GLUCOPHAGE-XR) 500 MG 24 hr tablet Take 500 mg by mouth daily with breakfast. 2 tabs daily    . pravastatin (PRAVACHOL) 20 MG tablet Take 20 mg by mouth daily.     No current facility-administered medications on file prior to visit.     No Known Allergies     History  Alcohol use Not on file        History  Smoking Status  . Former Smoker  . Packs/day: 1.00  . Types: Cigarettes  . Quit date: 02/25/1959  Smokeless Tobacco  . Former Systems developer  . Types: Snuff    Review of Systems  Constitutional: Negative.   HENT: Negative.   Eyes: Negative.   Respiratory: Negative.   Cardiovascular: Negative.   Gastrointestinal: Negative.   Genitourinary: Negative.   Musculoskeletal: Positive for joint pain.  Skin: Negative.   Neurological: Negative.   Endo/Heme/Allergies: Negative.   Psychiatric/Behavioral: Negative.     Objective      Vitals:   01/07/17 1116  BP: (!) 167/71  Pulse: 88  Resp: 18  Temp: 98.4 F (36.9 C)    Physical Exam  Constitutional: He is oriented to person, place, and time and well-developed, well-nourished, and in no distress.  HENT:  Head: Normocephalic and atraumatic.  Neck: Normal range of motion. Neck  supple.  Cardiovascular: Normal rate, regular rhythm and normal heart sounds.   No murmur heard. Pulmonary/Chest: Effort normal and breath sounds normal. He has no wheezes. He has no rales.  Abdominal: Soft. Bowel sounds are normal. He exhibits no distension. There is no tenderness.  Neurological: He is alert and oriented to person, place, and time.  Skin: Skin is warm and dry.  Vitals reviewed.   Assessment    Need for screening colonoscopy Plan    patient is scheduled for a screening colonoscopy on 02/09/2017.  The risks and benefits of the procedure including bleeding and perforation were fully explained to the patient, who gave informed consent.  GoLYTELY has been prescribed.  The patient realizes that that this may be his last need for a screening colonoscopy.  He is concerned that given his history of prostate cancer, he would like an examination.

## 2017-02-09 ENCOUNTER — Ambulatory Visit (HOSPITAL_COMMUNITY)
Admission: RE | Admit: 2017-02-09 | Discharge: 2017-02-10 | Disposition: A | Discharge status: Home / Self Care | Payer: Medicare Other | Source: Ambulatory Visit | Attending: General Surgery | Admitting: General Surgery

## 2017-02-09 ENCOUNTER — Encounter (HOSPITAL_COMMUNITY)
Admission: RE | Disposition: A | Discharge status: Home / Self Care | Payer: Self-pay | Source: Ambulatory Visit | Attending: General Surgery

## 2017-02-09 ENCOUNTER — Encounter (HOSPITAL_COMMUNITY): Payer: Self-pay

## 2017-02-09 DIAGNOSIS — K635 Polyp of colon: Secondary | ICD-10-CM | POA: Diagnosis not present

## 2017-02-09 DIAGNOSIS — D120 Benign neoplasm of cecum: Secondary | ICD-10-CM | POA: Diagnosis not present

## 2017-02-09 DIAGNOSIS — Z7984 Long term (current) use of oral hypoglycemic drugs: Secondary | ICD-10-CM | POA: Diagnosis not present

## 2017-02-09 DIAGNOSIS — Z01810 Encounter for preprocedural cardiovascular examination: Secondary | ICD-10-CM

## 2017-02-09 DIAGNOSIS — Z87891 Personal history of nicotine dependence: Secondary | ICD-10-CM | POA: Insufficient documentation

## 2017-02-09 DIAGNOSIS — D123 Benign neoplasm of transverse colon: Secondary | ICD-10-CM | POA: Diagnosis not present

## 2017-02-09 DIAGNOSIS — K5730 Diverticulosis of large intestine without perforation or abscess without bleeding: Secondary | ICD-10-CM | POA: Diagnosis not present

## 2017-02-09 DIAGNOSIS — Z1211 Encounter for screening for malignant neoplasm of colon: Secondary | ICD-10-CM | POA: Insufficient documentation

## 2017-02-09 DIAGNOSIS — Z79899 Other long term (current) drug therapy: Secondary | ICD-10-CM | POA: Insufficient documentation

## 2017-02-09 DIAGNOSIS — Z8546 Personal history of malignant neoplasm of prostate: Secondary | ICD-10-CM | POA: Diagnosis not present

## 2017-02-09 HISTORY — DX: Essential (primary) hypertension: I10

## 2017-02-09 HISTORY — PX: COLONOSCOPY: SHX5424

## 2017-02-09 LAB — GLUCOSE, CAPILLARY: GLUCOSE-CAPILLARY: 121 mg/dL — AB (ref 65–99)

## 2017-02-09 SURGERY — COLONOSCOPY
Anesthesia: Moderate Sedation

## 2017-02-09 SURGERY — COLONOSCOPY
Anesthesia: Monitor Anesthesia Care

## 2017-02-09 MED ORDER — MEPERIDINE HCL 100 MG/ML IJ SOLN
INTRAMUSCULAR | Status: AC
  Filled 2017-02-09: qty 2

## 2017-02-09 MED ORDER — MIDAZOLAM HCL 5 MG/5ML IJ SOLN
INTRAMUSCULAR | Status: AC
  Filled 2017-02-09: qty 10

## 2017-02-09 MED ORDER — STERILE WATER FOR IRRIGATION IR SOLN
Status: DC | PRN
  Administered 2017-02-09: 100 mL

## 2017-02-09 MED ORDER — MEPERIDINE HCL 50 MG/ML IJ SOLN
INTRAMUSCULAR | Status: DC | PRN
  Administered 2017-02-09: 50 mg via INTRAVENOUS

## 2017-02-09 MED ORDER — SODIUM CHLORIDE 0.9 % IV SOLN
INTRAVENOUS | Status: DC
  Administered 2017-02-09: 07:00:00 via INTRAVENOUS

## 2017-02-09 MED ORDER — MIDAZOLAM HCL 5 MG/5ML IJ SOLN
INTRAMUSCULAR | Status: DC | PRN
  Administered 2017-02-09: 3 mg via INTRAVENOUS

## 2017-02-09 MED ORDER — MEPERIDINE HCL 50 MG/ML IJ SOLN
INTRAMUSCULAR | Status: AC
  Filled 2017-02-09: qty 1

## 2017-02-09 NOTE — Interval H&P Note (Signed)
History and Physical Interval Note:  02/09/2017 7:23 AM  [name]  has presented today for surgery, with the diagnosis of screening  The various methods of treatment have been discussed with the patient and family. After consideration of risks, benefits and other options for treatment, the patient has consented to  Procedure(s): COLONOSCOPY (N/A) as a surgical intervention .  The patient's history has been reviewed, patient examined, no change in status, stable for surgery.  I have reviewed the patient's chart and labs.  Questions were answered to the patient's satisfaction.     Aviva Signs

## 2017-02-09 NOTE — Discharge Instructions (Signed)
Colonoscopy, Adult, Care After °This sheet gives you information about how to care for yourself after your procedure. Your health care provider may also give you more specific instructions. If you have problems or questions, contact your health care provider. °What can I expect after the procedure? °After the procedure, it is common to have: °· A small amount of blood in your stool for 24 hours after the procedure. °· Some gas. °· Mild abdominal cramping or bloating. ° °Follow these instructions at home: °General instructions ° °· For the first 24 hours after the procedure: °? Do not drive or use machinery. °? Do not sign important documents. °? Do not drink alcohol. °? Do your regular daily activities at a slower pace than normal. °? Eat soft, easy-to-digest foods. °? Rest often. °· Take over-the-counter or prescription medicines only as told by your health care provider. °· It is up to you to get the results of your procedure. Ask your health care provider, or the department performing the procedure, when your results will be ready. °Relieving cramping and bloating °· Try walking around when you have cramps or feel bloated. °· Apply heat to your abdomen as told by your health care provider. Use a heat source that your health care provider recommends, such as a moist heat pack or a heating pad. °? Place a towel between your skin and the heat source. °? Leave the heat on for 20-30 minutes. °? Remove the heat if your skin turns bright red. This is especially important if you are unable to feel pain, heat, or cold. You may have a greater risk of getting burned. °Eating and drinking °· Drink enough fluid to keep your urine clear or pale yellow. °· Resume your normal diet as instructed by your health care provider. Avoid heavy or fried foods that are hard to digest. °· Avoid drinking alcohol for as long as instructed by your health care provider. °Contact a health care provider if: °· You have blood in your stool 2-3  days after the procedure. °Get help right away if: °· You have more than a small spotting of blood in your stool. °· You pass large blood clots in your stool. °· Your abdomen is swollen. °· You have nausea or vomiting. °· You have a fever. °· You have increasing abdominal pain that is not relieved with medicine. °This information is not intended to replace advice given to you by your health care provider. Make sure you discuss any questions you have with your health care provider. ° ° °Colon Polyps °Polyps are tissue growths inside the body. Polyps can grow in many places, including the large intestine (colon). A polyp may be a round bump or a mushroom-shaped growth. You could have one polyp or several. °Most colon polyps are noncancerous (benign). However, some colon polyps can become cancerous over time. °What are the causes? °The exact cause of colon polyps is not known. °What increases the risk? °This condition is more likely to develop in people who: °· Have a family history of colon cancer or colon polyps. °· Are older than 50 or older than 45 if they are African American. °· Have inflammatory bowel disease, such as ulcerative colitis or Crohn disease. °· Are overweight. °· Smoke cigarettes. °· Do not get enough exercise. °· Drink too much alcohol. °· Eat a diet that is: °? High in fat and red meat. °? Low in fiber. °· Had childhood cancer that was treated with abdominal radiation. ° °What are the   signs or symptoms? °Most polyps do not cause symptoms. If you have symptoms, they may include: °· Blood coming from your rectum when having a bowel movement. °· Blood in your stool. The stool may look dark red or black. °· A change in bowel habits, such as constipation or diarrhea. ° °How is this diagnosed? °This condition is diagnosed with a colonoscopy. This is a procedure that uses a lighted, flexible scope to look at the inside of your colon. °How is this treated? °Treatment for this condition involves removing  any polyps that are found. Those polyps will then be tested for cancer. If cancer is found, your health care provider will talk to you about options for colon cancer treatment. °Follow these instructions at home: °Diet °· Eat plenty of fiber, such as fruits, vegetables, and whole grains. °· Eat foods that are high in calcium and vitamin D, such as milk, cheese, yogurt, eggs, liver, fish, and broccoli. °· Limit foods high in fat, red meats, and processed meats, such as hot dogs, sausage, bacon, and lunch meats. °· Maintain a healthy weight, or lose weight if recommended by your health care provider. °General instructions °· Do not smoke cigarettes. °· Do not drink alcohol excessively. °· Keep all follow-up visits as told by your health care provider. This is important. This includes keeping regularly scheduled colonoscopies. Talk to your health care provider about when you need a colonoscopy. °· Exercise every day or as told by your health care provider. °Contact a health care provider if: °· You have new or worsening bleeding during a bowel movement. °· You have new or increased blood in your stool. °· You have a change in bowel habits. °· You unexpectedly lose weight. °This information is not intended to replace advice given to you by your health care provider. Make sure you discuss any questions you have with your health care provider. ° °

## 2017-02-09 NOTE — Op Note (Signed)
[facility] Patient Name: [name] Procedure Date: 02/09/2017 7:24 AM MRN: [id number] Date of Birth: [birth year] Attending MD: Aviva Signs , MD CSN: [id number] Age: 76 Admit Type: Outpatient Procedure:                Colonoscopy Indications:              Screening for colorectal malignant neoplasm Providers:                Aviva Signs, MD, Rosina Lowenstein, RN, Randa Spike,                            Technician Referring MD:              Medicines:                Midazolam 3 mg IV, Meperidine 50 mg IV Complications:            No immediate complications. Estimated Blood Loss:     Estimated blood loss: none. Procedure:                Pre-Anesthesia Assessment:                           - Prior to the procedure, a History and Physical                            was performed, and patient medications and                            allergies were reviewed. The patient is competent.                            The risks and benefits of the procedure and the                            sedation options and risks were discussed with the                            patient. All questions were answered and informed                            consent was obtained. Patient identification and                            proposed procedure were verified by the physician,                            the nurse and the technician in the procedure room.                            Mental Status Examination: alert and oriented.                            Airway Examination: normal oropharyngeal airway and  neck mobility. Respiratory Examination: clear to                            auscultation. CV Examination: RRR, no murmurs, no                            S3 or S4. Prophylactic Antibiotics: The patient                            does not require prophylactic antibiotics. Prior                            Anticoagulants: The patient has taken aspirin, last                             dose was 1 day prior to procedure. ASA Grade                            Assessment: II - A patient with mild systemic                            disease. After reviewing the risks and benefits,                            the patient was deemed in satisfactory condition to                            undergo the procedure. The anesthesia plan was to                            use moderate sedation / analgesia (conscious                            sedation). Immediately prior to administration of                            medications, the patient was re-assessed for                            adequacy to receive sedatives. The heart rate,                            respiratory rate, oxygen saturations, blood                            pressure, adequacy of pulmonary ventilation, and                            response to care were monitored throughout the                            procedure. The physical status of the patient was  re-assessed after the procedure.                           After obtaining informed consent, the colonoscope                            was passed under direct vision. Throughout the                            procedure, the patient's blood pressure, pulse, and                            oxygen saturations were monitored continuously. The                            [id number]) was introduced through the anus                            and advanced to the the cecum, identified by the                            appendiceal orifice, ileocecal valve and palpation.                            No anatomical landmarks were photographed. The                            colonoscopy was performed without difficulty. The                            patient tolerated the procedure well. The quality                            of the bowel preparation was adequate. Scope In: 7:29:10 AM Scope Out: 7:42:13 AM Scope Withdrawal Time: 0 hours  9 minutes 47 seconds  Total Procedure Duration: 0 hours 13 minutes 3 seconds  Findings:      The perianal and digital rectal examinations were normal.      A 2 mm polyp was found in the cecum. The polyp was sessile. The polyp       was removed with a hot snare. Resection and retrieval were complete.       Estimated blood loss: none.      A 3 mm polyp was found in the mid transverse colon. The polyp was       sessile. The polyp was removed with a hot snare. Resection and retrieval       were complete. Estimated blood loss: none. Estimated blood loss: none.      Multiple small-mouthed diverticula were found in the sigmoid colon.      The exam was otherwise without abnormality on direct and retroflexion       views. Impression:               - One 2 mm polyp in the cecum, removed with a hot  snare. Resected and retrieved.                           - One 3 mm polyp in the mid transverse colon,                            removed with a hot snare. Resected and retrieved.                           - Diverticulosis in the sigmoid colon.                           - The examination was otherwise normal on direct                            and retroflexion views. Moderate Sedation:      Moderate (conscious) sedation was administered by the endoscopy nurse       and supervised by the endoscopist. The following parameters were       monitored: oxygen saturation, heart rate, blood pressure, and response       to care. Total physician intraservice time was 13 minutes. Recommendation:           - The signs and symptoms of potential delayed                            complications were discussed with the patient.                           - Patient has a contact number available for                            emergencies.                           - Return to normal activities tomorrow.                           - Resume previous diet.                           - Continue  present medications.                           - Await pathology results.                           - Repeat colonoscopy in 5 days for surveillance of                            multiple polyps. Procedure Code(s):        --- Professional ---                           [phone], Colonoscopy, flexible; with removal of  tumor(s), polyp(s), or other lesion(s) by snare                            technique                           99152, Moderate sedation services provided by the                            same physician or other qualified health care                            professional performing the diagnostic or                            therapeutic service that the sedation supports,                            requiring the presence of an independent trained                            observer to assist in the monitoring of the                            patient's level of consciousness and physiological                            status; initial 15 minutes of intraservice time,                            patient age 48 years or older Diagnosis Code(s):        --- Professional ---                           Z12.11, Encounter for screening for malignant                            neoplasm of colon                           D12.0, Benign neoplasm of cecum                           D12.3, Benign neoplasm of transverse colon (hepatic                            flexure or splenic flexure)                           K57.30, Diverticulosis of large intestine without                            perforation or abscess without bleeding CPT copyright 2016 [employer]. All rights reserved. The codes documented in this report are preliminary and upon coder review may  be revised to meet current compliance requirements. Victor [place]  Arnoldo Morale, MD Aviva Signs, MD 02/09/2017 7:49:40 AM This report has been signed electronically. Number of Addenda: 0

## 2017-02-18 ENCOUNTER — Encounter (HOSPITAL_COMMUNITY): Payer: Self-pay | Admitting: General Surgery

## 2017-03-05 NOTE — Progress Notes (Signed)
Patient ID: [name], male   DOB: 08-29-1941, 76 y.o.   MRN: [id number]     Cardiology Office Note   Date:  03/08/2017   ID:  [name], DOB Apr 15, 1941, MRN [id number]  PCP:  Sandi Mealy, MD  Cardiologist:   Jenkins Rouge, MD   No chief complaint on file.     History of Present Illness: [name] is a 76 y.o. male who presents for post hospital f/u.  Admitted [facility] 5/2-01/24/16  Acute Bronchitis , Dyspnea ? CHF and abnormal ECG.   CTA with no PE prior granulomatours disease.  Sent  Home on prednisone taper.  CRF;s HTN and elevated lipids Had MI in [place] to [place].  Cath Dr Ubaldo Glassing and transferred to [facility] for CABG with Dr Servando Snare.  Details of surgery not available in Epic.  Saw Dr Ubaldo Glassing For a year or two post CABG but then just f/u with Dr Gerarda Fraction.  Since being hospitalized still with more exertional dyspnea.  No chest pain.  No edema palpitations or syncope. Compliant with meds.   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 Living in motor home [place] now.    See below [facility] and echo done June 2017 were fine   Since having bronchitis last May has felt lethargic and no energy Primary gave him B12 and iron with no help Low T but cannot be on testosterone with history of prostate cancer. Seeing Dr Nevada Crane as new primary in am     Past Medical History:  Diagnosis Date  . Cancer ([place])   . Hypertension     Past Surgical History:  Procedure Laterality Date  . COLONOSCOPY N/A 02/09/2017   Procedure: COLONOSCOPY;  Surgeon: Aviva Signs, MD;  [facility];  Service: Gastroenterology;  Laterality: N/A;     Current Outpatient Prescriptions  Medication Sig Dispense Refill  . amLODipine-benazepril (LOTREL) 5-20 MG capsule Take 1 capsule by mouth daily.    Marland Kitchen aspirin EC 81 MG tablet Take 81 mg by mouth daily.    . cetirizine (ZYRTEC) 10 MG tablet Take 10 mg by mouth daily as needed  for allergies.    . cyanocobalamin (,VITAMIN B-12,) 1000 MCG/ML injection Inject 1 mL into the muscle every 30 (thirty) days. Beginning of the month  3  . metFORMIN (GLUCOPHAGE) 1000 MG tablet Take 1,000 mg by mouth 2 (two) times daily with a meal.  3  . simvastatin (ZOCOR) 10 MG tablet Take 10 mg by mouth daily.    . Vitamin D, Ergocalciferol, (DRISDOL) 50000 units CAPS capsule Take 50,000 Units by mouth once a week. Friday  1   No current facility-administered medications for this visit.     Allergies:   Patient has no known allergies.    Social History:  The patient  reports that he quit smoking about 46 years ago. His smoking use included Cigarettes. He smoked 1.00 pack per day. He has quit using smokeless tobacco. His smokeless tobacco use included Snuff. He reports that he does not drink alcohol or use drugs.   Family History:  The patient's family history includes Dementia in his mother; Diabetes in his father.    ROS:  Please see the history of present illness.   Otherwise, review of systems are positive for none.   All other systems are reviewed and negative.    PHYSICAL EXAM: VS:  BP 138/80   Pulse 90   Ht  5\' 9"  (1.753 m)   Wt 91.2 kg (201 lb)   SpO2 98%   BMI 29.68 kg/m  , BMI Body mass index is 29.68 kg/m. Affect appropriate Healthy:  appears stated age 68: normal Neck supple with no adenopathy JVP normal no bruits no thyromegaly Lungs clear with no wheezing and good diaphragmatic motion Heart:  S1/S2 no murmur, no rub, gallop or click PMI normal Abdomen: benighn, BS positve, no tenderness, no AAA no bruit.  No HSM or HJR Distal pulses intact with no bruits No edema Neuro non-focal Skin warm and dry No muscular weakness     EKG:   02/25/16  NSR normal ECG 03/08/17  SR rate 90 poor R wave progression    Recent Labs: No results found for requested labs within last 8760 hours.    Lipid Panel No results found for: CHOL, TRIG, HDL, CHOLHDL, VLDL,  LDLCALC, LDLDIRECT    Wt Readings from Last 3 Encounters:  03/08/17 91.2 kg (201 lb)  01/07/17 91.6 kg (202 lb)  02/25/16 87.5 kg (193 lb)      Other studies Reviewed: Additional studies/ records that were reviewed today include: Dr Leamon Arnt records and notes from [facility] admission .    ASSESSMENT AND PLAN:  1.  CAD/CABG:   Normal myovue 03/05/16 EF 55% continue medical RX  2. Dysonea:   EF echo 03/05/16 50-55% no valve disease and only mild relaxation abnormality 3. HTN:  Well controlled.  Continue current medications and low sodium Dash type diet.   4 Chol: given malaise will hold statin for 8 weeks and see if he feels better  5. DM:  Discussed low carb diet.  Target hemoglobin A1c is 6.5 or less.  Continue current medications. Hold metfomin for myovue study    Current medicines are reviewed at length with the patient today.  The patient does not have concerns regarding medicines.  The following changes have been made:  Hold statin for 8 weeks   Labs/ tests ordered today include:   Orders Placed This Encounter  Procedures  . EKG 12-Lead     Disposition:   FU with me in a year      Signed, Jenkins Rouge, MD  03/08/2017 1:09 PM    [facility] [address], [place], [place]  [address] Phone: [phone]; Fax: [phone]

## 2017-03-08 ENCOUNTER — Ambulatory Visit (INDEPENDENT_AMBULATORY_CARE_PROVIDER_SITE_OTHER): Payer: Medicare Other | Admitting: Cardiovascular Disease

## 2017-03-08 ENCOUNTER — Encounter: Payer: Self-pay | Admitting: Cardiovascular Disease

## 2017-03-08 VITALS — BP 138/80 | HR 90 | Ht 69.0 in | Wt 201.0 lb

## 2017-03-08 DIAGNOSIS — I10 Essential (primary) hypertension: Secondary | ICD-10-CM

## 2017-03-08 NOTE — Patient Instructions (Signed)

## 2017-03-10 DIAGNOSIS — E782 Mixed hyperlipidemia: Secondary | ICD-10-CM | POA: Diagnosis not present

## 2017-03-10 DIAGNOSIS — E291 Testicular hypofunction: Secondary | ICD-10-CM | POA: Diagnosis not present

## 2017-03-10 DIAGNOSIS — D075 Carcinoma in situ of prostate: Secondary | ICD-10-CM | POA: Diagnosis not present

## 2017-03-10 DIAGNOSIS — I10 Essential (primary) hypertension: Secondary | ICD-10-CM | POA: Diagnosis not present

## 2017-03-10 DIAGNOSIS — E1165 Type 2 diabetes mellitus with hyperglycemia: Secondary | ICD-10-CM | POA: Diagnosis not present

## 2017-04-07 DIAGNOSIS — E291 Testicular hypofunction: Secondary | ICD-10-CM | POA: Diagnosis not present

## 2017-04-07 DIAGNOSIS — I10 Essential (primary) hypertension: Secondary | ICD-10-CM | POA: Diagnosis not present

## 2017-04-07 DIAGNOSIS — E1165 Type 2 diabetes mellitus with hyperglycemia: Secondary | ICD-10-CM | POA: Diagnosis not present

## 2017-04-22 DIAGNOSIS — H6120 Impacted cerumen, unspecified ear: Secondary | ICD-10-CM | POA: Diagnosis not present

## 2017-04-22 DIAGNOSIS — L259 Unspecified contact dermatitis, unspecified cause: Secondary | ICD-10-CM | POA: Diagnosis not present

## 2017-04-22 DIAGNOSIS — H9190 Unspecified hearing loss, unspecified ear: Secondary | ICD-10-CM | POA: Diagnosis not present

## 2017-06-22 DIAGNOSIS — E119 Type 2 diabetes mellitus without complications: Secondary | ICD-10-CM | POA: Diagnosis not present

## 2017-06-22 DIAGNOSIS — Z7984 Long term (current) use of oral hypoglycemic drugs: Secondary | ICD-10-CM | POA: Diagnosis not present

## 2017-06-22 DIAGNOSIS — H353131 Nonexudative age-related macular degeneration, bilateral, early dry stage: Secondary | ICD-10-CM | POA: Diagnosis not present

## 2017-06-22 DIAGNOSIS — H1131 Conjunctival hemorrhage, right eye: Secondary | ICD-10-CM | POA: Diagnosis not present

## 2017-07-08 DIAGNOSIS — E1165 Type 2 diabetes mellitus with hyperglycemia: Secondary | ICD-10-CM | POA: Diagnosis not present

## 2017-07-08 DIAGNOSIS — I10 Essential (primary) hypertension: Secondary | ICD-10-CM | POA: Diagnosis not present

## 2017-07-12 DIAGNOSIS — Z0000 Encounter for general adult medical examination without abnormal findings: Secondary | ICD-10-CM | POA: Diagnosis not present

## 2017-07-12 DIAGNOSIS — E291 Testicular hypofunction: Secondary | ICD-10-CM | POA: Diagnosis not present

## 2017-08-31 NOTE — Progress Notes (Signed)
Patient ID: [name], male   DOB: May 27, 1941, 76 y.o.   MRN: [id number]     Cardiology Office Note   Date:  09/06/2017   ID:  VASILI FOK, DOB 1941-05-22, MRN [id number]  PCP:  Celene Squibb, MD  Cardiologist:   Jenkins Rouge, MD   No chief complaint on file.     History of Present Illness: [name] is a 76 y.o. male who presents for f/u CAD/CABG   [facility]mitted [name] 5/2-01/24/16  Acute Bronchitis , Dyspnea ? CHF and abnormal ECG.   CTA with no PE prior granulomatours disease.  Sent  [place] on prednisone taper.  CRF;s HTN and elevated lipids Had MI in [name].  Cath Dr Ubaldo Glassing and transferred to [facility] for CABG with Dr Servando Snare.  Details of surgery not available in Epic.  Saw Dr Ubaldo Glassing For a year or two post CABG but then just f/u with Dr Gerarda Fraction.  Since being hospitalized still with more exertional dyspnea.  No chest pain.  No edema palpitations or syncope. Compliant with meds.   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 Living in [name] now.    See below [facility] [name] and echo done June 2017 were fine   Last May has felt lethargic and no energy Primary gave him B12 and iron with no help Low T but cannot be on testosterone with history of prostate cancer. Seeing Dr Nevada Crane as new primary  June statin held to see if fatigue improved and it did   Primary started him on cholisted  With repeat labs in March      Past Medical History:  Diagnosis Date  . Cancer ([name])   . Hypertension     Past Surgical History:  Procedure Laterality Date  . COLONOSCOPY N/A 02/09/2017   Procedure: COLONOSCOPY;  Surgeon: Aviva Signs, MD;  [facility];  Service: Gastroenterology;  Laterality: N/A;     Current Outpatient Medications  Medication Sig Dispense Refill  . amLODipine-benazepril (LOTREL) 5-20 MG capsule Take 1 capsule by mouth daily.    Marland Kitchen aspirin EC 81 MG tablet Take 81 mg by mouth  daily.    . cetirizine (ZYRTEC) 10 MG tablet Take 10 mg by mouth daily as needed for allergies.    . cyanocobalamin (,VITAMIN B-12,) 1000 MCG/ML injection Inject 1 mL into the muscle every 30 (thirty) days. Beginning of the month  3  . metFORMIN (GLUCOPHAGE) 1000 MG tablet Take 1,000 mg by mouth 2 (two) times daily with a meal.  3  . Vitamin D, Ergocalciferol, (DRISDOL) 50000 units CAPS capsule Take 50,000 Units by mouth once a week. Friday  1   No current facility-administered medications for this visit.     Allergies:   Patient has no known allergies.    Social History:  The patient  reports that he quit smoking about 46 years ago. His smoking use included cigarettes. He smoked 1.00 pack per day. He has quit using smokeless tobacco. His smokeless tobacco use included snuff. He reports that he does not drink alcohol or use drugs.   Family History:  The patient's family history includes [name] in his mother; [name] in his father.    ROS:  Please see the history of present illness.   Otherwise, review of systems are positive for none.   All other systems are reviewed and negative.    PHYSICAL EXAM: VS:  BP (!) 148/76 (  BP Location: Left Arm)   Pulse 87   Ht 5\' 9"  (1.753 m)   Wt 201 lb (91.2 kg)   SpO2 98%   BMI 29.68 kg/m  , BMI Body mass index is 29.68 kg/m. Affect appropriate Healthy:  appears stated age 42: normal Neck supple with no adenopathy JVP normal no bruits no thyromegaly Lungs clear with no wheezing and good diaphragmatic motion Heart:  S1/S2 no murmur, no rub, gallop or click PMI normal post sternotomy  Abdomen: benighn, BS positve, no tenderness, no AAA no bruit.  No HSM or HJR Distal pulses intact with no bruits No edema Neuro non-focal Skin warm and dry No muscular weakness      EKG:   02/25/16  NSR normal ECG 03/08/17  SR rate 90 poor R wave progression    Recent Labs: No results found for requested labs within last 8760 hours.    Lipid  Panel No results found for: CHOL, TRIG, HDL, CHOLHDL, VLDL, LDLCALC, LDLDIRECT    Wt Readings from Last 3 Encounters:  09/06/17 201 lb (91.2 kg)  03/08/17 201 lb (91.2 kg)  01/07/17 202 lb (91.6 kg)      Other studies Reviewed: Additional studies/ records that were reviewed today include: Dr Leamon Arnt records and notes from [facility] admission .    ASSESSMENT AND PLAN:  1.  CAD/CABG:   Normal myovue 03/05/16 EF 55% continue medical RX  2. Dyspnea:   EF echo 03/05/16 50-55% no valve disease and only mild relaxation abnormality 3. HTN:  Well controlled.  Continue current medications and low sodium Dash type diet.   4 Chol: statin held for malaise in June Improved on colestid f/u labs primary  5. DM:  Discussed low carb diet.  Target hemoglobin A1c is 6.5 or less.  Continue current medications. Hold metfomin for myovue study    Current medicines are reviewed at length with the patient today.  The patient does not have concerns regarding medicines.  The following changes have been made:    Labs/ tests ordered today include:   No orders of the defined types were placed in this encounter.    Disposition:   FU in [place] per patient request in a year     Signed, Jenkins Rouge, MD  09/06/2017 2:32 PM    [facility] [place], [place], [place]  [address] Phone: [phone]; Fax: [phone]

## 2017-09-06 ENCOUNTER — Encounter: Payer: Self-pay | Admitting: Cardiovascular Disease

## 2017-09-06 ENCOUNTER — Ambulatory Visit: Payer: Medicare Other | Admitting: Cardiovascular Disease

## 2017-09-06 VITALS — BP 148/76 | HR 87 | Ht 69.0 in | Wt 201.0 lb

## 2017-09-06 DIAGNOSIS — I2510 Atherosclerotic heart disease of native coronary artery without angina pectoris: Secondary | ICD-10-CM | POA: Diagnosis not present

## 2017-09-06 NOTE — Patient Instructions (Signed)
Medication Instructions:  Your physician recommends that you continue on your current medications as directed. Please refer to the Current Medication list given to you today.   Labwork: NONE  Testing/Procedures: NONE  Follow-Up: Your physician wants you to follow-up in: [place].  You will receive a reminder letter in the mail two months in advance. If you don't receive a letter, please call our office to schedule the follow-up appointment.'  Any Other Special Instructions Will Be Listed Below (If Applicable).     If you need a refill on your cardiac medications before your next appointment, please call your pharmacy.

## 2017-11-10 DIAGNOSIS — I10 Essential (primary) hypertension: Secondary | ICD-10-CM | POA: Diagnosis not present

## 2017-11-10 DIAGNOSIS — E291 Testicular hypofunction: Secondary | ICD-10-CM | POA: Diagnosis not present

## 2017-11-10 DIAGNOSIS — E782 Mixed hyperlipidemia: Secondary | ICD-10-CM | POA: Diagnosis not present

## 2017-11-10 DIAGNOSIS — E1165 Type 2 diabetes mellitus with hyperglycemia: Secondary | ICD-10-CM | POA: Diagnosis not present

## 2017-11-12 DIAGNOSIS — E782 Mixed hyperlipidemia: Secondary | ICD-10-CM | POA: Diagnosis not present

## 2017-11-12 DIAGNOSIS — E1165 Type 2 diabetes mellitus with hyperglycemia: Secondary | ICD-10-CM | POA: Diagnosis not present

## 2017-11-12 DIAGNOSIS — E291 Testicular hypofunction: Secondary | ICD-10-CM | POA: Diagnosis not present

## 2017-11-12 DIAGNOSIS — D075 Carcinoma in situ of prostate: Secondary | ICD-10-CM | POA: Diagnosis not present

## 2017-11-12 DIAGNOSIS — I10 Essential (primary) hypertension: Secondary | ICD-10-CM | POA: Diagnosis not present

## 2017-11-23 DIAGNOSIS — I25810 Atherosclerosis of coronary artery bypass graft(s) without angina pectoris: Secondary | ICD-10-CM | POA: Diagnosis not present

## 2017-11-23 DIAGNOSIS — J060 Acute laryngopharyngitis: Secondary | ICD-10-CM | POA: Diagnosis not present

## 2018-02-18 DIAGNOSIS — I2510 Atherosclerotic heart disease of native coronary artery without angina pectoris: Secondary | ICD-10-CM | POA: Diagnosis not present

## 2018-02-18 DIAGNOSIS — D519 Vitamin B12 deficiency anemia, unspecified: Secondary | ICD-10-CM | POA: Diagnosis not present

## 2018-02-18 DIAGNOSIS — E119 Type 2 diabetes mellitus without complications: Secondary | ICD-10-CM | POA: Diagnosis not present

## 2018-02-22 DIAGNOSIS — I10 Essential (primary) hypertension: Secondary | ICD-10-CM | POA: Diagnosis not present

## 2018-02-22 DIAGNOSIS — I2510 Atherosclerotic heart disease of native coronary artery without angina pectoris: Secondary | ICD-10-CM | POA: Diagnosis not present

## 2018-02-22 DIAGNOSIS — C61 Malignant neoplasm of prostate: Secondary | ICD-10-CM | POA: Diagnosis not present

## 2018-02-22 DIAGNOSIS — E782 Mixed hyperlipidemia: Secondary | ICD-10-CM | POA: Diagnosis not present

## 2018-02-22 DIAGNOSIS — E1169 Type 2 diabetes mellitus with other specified complication: Secondary | ICD-10-CM | POA: Diagnosis not present

## 2018-02-28 DIAGNOSIS — R293 Abnormal posture: Secondary | ICD-10-CM | POA: Diagnosis not present

## 2018-02-28 DIAGNOSIS — R262 Difficulty in walking, not elsewhere classified: Secondary | ICD-10-CM | POA: Diagnosis not present

## 2018-02-28 DIAGNOSIS — R260 Ataxic gait: Secondary | ICD-10-CM | POA: Diagnosis not present

## 2018-02-28 DIAGNOSIS — R269 Unspecified abnormalities of gait and mobility: Secondary | ICD-10-CM | POA: Diagnosis not present

## 2018-03-01 DIAGNOSIS — R269 Unspecified abnormalities of gait and mobility: Secondary | ICD-10-CM | POA: Diagnosis not present

## 2018-03-01 DIAGNOSIS — R260 Ataxic gait: Secondary | ICD-10-CM | POA: Diagnosis not present

## 2018-03-01 DIAGNOSIS — R293 Abnormal posture: Secondary | ICD-10-CM | POA: Diagnosis not present

## 2018-03-01 DIAGNOSIS — R262 Difficulty in walking, not elsewhere classified: Secondary | ICD-10-CM | POA: Diagnosis not present

## 2018-04-12 DIAGNOSIS — E782 Mixed hyperlipidemia: Secondary | ICD-10-CM | POA: Diagnosis not present

## 2018-04-12 DIAGNOSIS — D075 Carcinoma in situ of prostate: Secondary | ICD-10-CM | POA: Diagnosis not present

## 2018-04-12 DIAGNOSIS — I25810 Atherosclerosis of coronary artery bypass graft(s) without angina pectoris: Secondary | ICD-10-CM | POA: Diagnosis not present

## 2018-04-12 DIAGNOSIS — I10 Essential (primary) hypertension: Secondary | ICD-10-CM | POA: Diagnosis not present

## 2018-04-12 DIAGNOSIS — E1165 Type 2 diabetes mellitus with hyperglycemia: Secondary | ICD-10-CM | POA: Diagnosis not present

## 2018-06-15 DIAGNOSIS — I10 Essential (primary) hypertension: Secondary | ICD-10-CM | POA: Diagnosis not present

## 2018-06-15 DIAGNOSIS — E782 Mixed hyperlipidemia: Secondary | ICD-10-CM | POA: Diagnosis not present

## 2018-06-15 DIAGNOSIS — E1165 Type 2 diabetes mellitus with hyperglycemia: Secondary | ICD-10-CM | POA: Diagnosis not present

## 2018-06-15 DIAGNOSIS — E1169 Type 2 diabetes mellitus with other specified complication: Secondary | ICD-10-CM | POA: Diagnosis not present

## 2018-06-20 DIAGNOSIS — I2510 Atherosclerotic heart disease of native coronary artery without angina pectoris: Secondary | ICD-10-CM | POA: Diagnosis not present

## 2018-06-20 DIAGNOSIS — I10 Essential (primary) hypertension: Secondary | ICD-10-CM | POA: Diagnosis not present

## 2018-06-20 DIAGNOSIS — E1169 Type 2 diabetes mellitus with other specified complication: Secondary | ICD-10-CM | POA: Diagnosis not present

## 2018-06-20 DIAGNOSIS — E782 Mixed hyperlipidemia: Secondary | ICD-10-CM | POA: Diagnosis not present

## 2018-06-20 DIAGNOSIS — Z0000 Encounter for general adult medical examination without abnormal findings: Secondary | ICD-10-CM | POA: Diagnosis not present

## 2018-08-02 DIAGNOSIS — H353211 Exudative age-related macular degeneration, right eye, with active choroidal neovascularization: Secondary | ICD-10-CM | POA: Diagnosis not present

## 2018-08-02 DIAGNOSIS — Z7984 Long term (current) use of oral hypoglycemic drugs: Secondary | ICD-10-CM | POA: Diagnosis not present

## 2018-08-02 DIAGNOSIS — H353121 Nonexudative age-related macular degeneration, left eye, early dry stage: Secondary | ICD-10-CM | POA: Diagnosis not present

## 2018-08-02 DIAGNOSIS — E119 Type 2 diabetes mellitus without complications: Secondary | ICD-10-CM | POA: Diagnosis not present

## 2018-08-03 DIAGNOSIS — H353211 Exudative age-related macular degeneration, right eye, with active choroidal neovascularization: Secondary | ICD-10-CM | POA: Diagnosis not present

## 2018-08-03 DIAGNOSIS — H26492 Other secondary cataract, left eye: Secondary | ICD-10-CM | POA: Diagnosis not present

## 2018-08-03 DIAGNOSIS — H353122 Nonexudative age-related macular degeneration, left eye, intermediate dry stage: Secondary | ICD-10-CM | POA: Diagnosis not present

## 2018-08-26 DIAGNOSIS — H353211 Exudative age-related macular degeneration, right eye, with active choroidal neovascularization: Secondary | ICD-10-CM | POA: Diagnosis not present

## 2018-09-16 DIAGNOSIS — E1169 Type 2 diabetes mellitus with other specified complication: Secondary | ICD-10-CM | POA: Diagnosis not present

## 2018-09-16 DIAGNOSIS — E1165 Type 2 diabetes mellitus with hyperglycemia: Secondary | ICD-10-CM | POA: Diagnosis not present

## 2018-09-16 DIAGNOSIS — I10 Essential (primary) hypertension: Secondary | ICD-10-CM | POA: Diagnosis not present

## 2018-09-16 DIAGNOSIS — E782 Mixed hyperlipidemia: Secondary | ICD-10-CM | POA: Diagnosis not present

## 2018-09-20 DIAGNOSIS — E119 Type 2 diabetes mellitus without complications: Secondary | ICD-10-CM | POA: Diagnosis not present

## 2018-09-20 DIAGNOSIS — I10 Essential (primary) hypertension: Secondary | ICD-10-CM | POA: Diagnosis not present

## 2018-09-20 DIAGNOSIS — H3530 Unspecified macular degeneration: Secondary | ICD-10-CM | POA: Diagnosis not present

## 2018-09-20 DIAGNOSIS — E782 Mixed hyperlipidemia: Secondary | ICD-10-CM | POA: Diagnosis not present

## 2018-09-20 DIAGNOSIS — I2510 Atherosclerotic heart disease of native coronary artery without angina pectoris: Secondary | ICD-10-CM | POA: Diagnosis not present

## 2018-09-23 ENCOUNTER — Encounter: Payer: Self-pay | Admitting: Cardiovascular Disease

## 2018-09-23 ENCOUNTER — Ambulatory Visit: Payer: Medicare Other | Admitting: Cardiovascular Disease

## 2018-09-23 VITALS — BP 138/82 | HR 85 | Ht 69.0 in | Wt 200.0 lb

## 2018-09-23 DIAGNOSIS — I10 Essential (primary) hypertension: Secondary | ICD-10-CM | POA: Diagnosis not present

## 2018-09-23 DIAGNOSIS — I25708 Atherosclerosis of coronary artery bypass graft(s), unspecified, with other forms of angina pectoris: Secondary | ICD-10-CM

## 2018-09-23 DIAGNOSIS — E119 Type 2 diabetes mellitus without complications: Secondary | ICD-10-CM | POA: Diagnosis not present

## 2018-09-23 DIAGNOSIS — E785 Hyperlipidemia, unspecified: Secondary | ICD-10-CM | POA: Diagnosis not present

## 2018-09-23 MED ORDER — NITROGLYCERIN 0.4 MG SL SUBL: 0.4000 mg | SUBLINGUAL_TABLET | SUBLINGUAL | 3 refills | Status: DC | PRN

## 2018-09-23 NOTE — Progress Notes (Signed)
SUBJECTIVE: The patient presents to establish care with me in our [place] office.  This is my first time meeting him.  He has a history of coronary artery disease and CABG which she had in 1999 at [facility].  He underwent a normal nuclear stress test on 03/05/2016.  Echocardiogram on 03/05/2016 showed normal left ventricular systolic function, LVEF 50 to 55%, and mild diastolic dysfunction.  He is here with his wife who is also my patient.  The patient denies any symptoms of chest pain, palpitations, shortness of breath, lightheadedness, dizziness, leg swelling, orthopnea, PND, and syncope.  ECG performed in the office today which I ordered and personally interpreted demonstrates normal sinus rhythm with old inferior infarct and probable LVH.  He does not do any intentional exercise but does yard work around the house.  He said his A1c is 6.2%.    Review of Systems: As per "subjective", otherwise negative.  No Known Allergies  Current Outpatient Medications  Medication Sig Dispense Refill  . amLODipine-benazepril (LOTREL) 5-20 MG capsule Take 1 capsule by mouth daily.    Marland Kitchen aspirin EC 81 MG tablet Take 81 mg by mouth daily.    . cetirizine (ZYRTEC) 10 MG tablet Take 10 mg by mouth daily as needed for allergies.    Marland Kitchen glipiZIDE (GLUCOTROL) 5 MG tablet Take 5 mg by mouth daily before breakfast.    . metFORMIN (GLUCOPHAGE) 1000 MG tablet Take 1,000 mg by mouth 2 (two) times daily with a meal.  3  . simvastatin (ZOCOR) 10 MG tablet Take 10 mg by mouth daily.     No current facility-administered medications for this visit.     Past Medical History:  Diagnosis Date  . Cancer ([place])   . Hypertension     Past Surgical History:  Procedure Laterality Date  . COLONOSCOPY N/A 02/09/2017   Procedure: COLONOSCOPY;  Surgeon: Aviva Signs, MD;  Location[facility];  Service: Gastroenterology;  Laterality: N/A;    Social History   Socioeconomic History  . Marital status: Married   Spouse name: Not on file  . Number of children: Not on file  . Years of education: Not on file  . Highest education level: Not on file  Occupational History  . Not on file  Social Needs  . Financial resource strain: Not on file  . Food insecurity:    Worry: Not on file    Inability: Not on file  . Transportation needs:    Medical: Not on file    Non-medical: Not on file  Tobacco Use  . Smoking status: Former Smoker    Packs/day: 1.00    Types: Cigarettes    Last attempt to quit: 02/25/1971    Years since quitting: 47.6  . Smokeless tobacco: Former Systems developer    Types: Snuff  Substance and Sexual Activity  . Alcohol use: No    Alcohol/week: 0.0 standard drinks  . Drug use: No  . Sexual activity: Not on file  Lifestyle  . Physical activity:    Days per week: Not on file    Minutes per session: Not on file  . Stress: Not on file  Relationships  . Social connections:    Talks on phone: Not on file    Gets together: Not on file    Attends religious service: Not on file    Active member of club or organization: Not on file    Attends meetings of clubs or organizations: Not on file  Relationship status: Not on file  . Intimate partner violence:    Fear of current or ex partner: Not on file    Emotionally abused: Not on file    Physically abused: Not on file    Forced sexual activity: Not on file  Other Topics Concern  . Not on file  Social History Narrative  . Not on file     Vitals:   09/23/18 0945  BP: 138/82  Pulse: 85  SpO2: 97%  Weight: 200 lb (90.7 kg)  Height: 5\' 9"  (1.753 m)    Wt Readings from Last 3 Encounters:  09/23/18 200 lb (90.7 kg)  09/06/17 201 lb (91.2 kg)  03/08/17 201 lb (91.2 kg)     PHYSICAL EXAM General: NAD HEENT: Normal. Neck: No JVD, no thyromegaly. Lungs: Clear to auscultation bilaterally with normal respiratory effort. CV: Regular rate and rhythm, normal S1/S2, no S3/S4, no murmur. No pretibial or periankle edema.  No carotid  bruit.   Abdomen: Soft, nontender, no distention.  Neurologic: Alert and oriented.  Psych: Normal affect. Skin: Normal. Musculoskeletal: No gross deformities.    ECG: Reviewed above under Subjective   Labs: Lab Results  Component Value Date/Time   K 3.9 12/25/2010 01:47 PM   BUN 13 12/25/2010 01:47 PM   CREATININE 0.91 12/25/2010 01:47 PM   ALT 21 04/01/2010 09:00 AM   HGB 13.8 12/25/2010 01:47 PM     Lipids: No results found for: LDLCALC, LDLDIRECT, CHOL, TRIG, HDL     ASSESSMENT AND PLAN: 1.  Coronary artery disease: History of CABG. symptomatically stable.  Normal nuclear stress test on 03/05/2016 with echocardiogram at that time demonstrated normal left ventricular systolic function and mild diastolic dysfunction.  Currently on aspirin and simvastatin.  I will provide a prescription for nitroglycerin.  2.  Hypertension: Blood pressure is reasonably controlled.  No changes to therapy.  3.  Hypercholesterolemia: Currently on simvastatin.  I will obtain a copy of lipids from PCP.  4.  Type 2 diabetes mellitus: HbA1c reportedly 6.2%.  Currently on metformin and glipizide.    Disposition: Follow up 1 year   Kate Sable, M.D., F.A.C.C.

## 2018-09-23 NOTE — Patient Instructions (Signed)
Medication Instructions:   Begin Nitroglycerin as needed for severe chest pain only.   Continue all other medications.    Labwork: none  Testing/Procedures: none  Follow-Up: Your physician wants you to follow up in:  1 year.  You will receive a reminder letter in the mail one-two months in advance.  If you don't receive a letter, please call our office to schedule the follow up appointment   Any Other Special Instructions Will Be Listed Below (If Applicable).  If you need a refill on your cardiac medications before your next appointment, please call your pharmacy.

## 2018-09-30 ENCOUNTER — Encounter: Payer: Self-pay | Admitting: *Deleted

## 2018-09-30 DIAGNOSIS — H35372 Puckering of macula, left eye: Secondary | ICD-10-CM | POA: Diagnosis not present

## 2018-09-30 DIAGNOSIS — H353211 Exudative age-related macular degeneration, right eye, with active choroidal neovascularization: Secondary | ICD-10-CM | POA: Diagnosis not present

## 2018-09-30 DIAGNOSIS — H353122 Nonexudative age-related macular degeneration, left eye, intermediate dry stage: Secondary | ICD-10-CM | POA: Diagnosis not present

## 2018-10-18 DIAGNOSIS — I2510 Atherosclerotic heart disease of native coronary artery without angina pectoris: Secondary | ICD-10-CM | POA: Diagnosis not present

## 2018-10-18 DIAGNOSIS — I10 Essential (primary) hypertension: Secondary | ICD-10-CM | POA: Diagnosis not present

## 2018-10-18 DIAGNOSIS — E782 Mixed hyperlipidemia: Secondary | ICD-10-CM | POA: Diagnosis not present

## 2018-10-18 DIAGNOSIS — E119 Type 2 diabetes mellitus without complications: Secondary | ICD-10-CM | POA: Diagnosis not present

## 2018-10-28 DIAGNOSIS — D3131 Benign neoplasm of right choroid: Secondary | ICD-10-CM | POA: Diagnosis not present

## 2018-10-28 DIAGNOSIS — H353211 Exudative age-related macular degeneration, right eye, with active choroidal neovascularization: Secondary | ICD-10-CM | POA: Diagnosis not present

## 2018-10-28 DIAGNOSIS — H35372 Puckering of macula, left eye: Secondary | ICD-10-CM | POA: Diagnosis not present

## 2018-10-28 DIAGNOSIS — H353122 Nonexudative age-related macular degeneration, left eye, intermediate dry stage: Secondary | ICD-10-CM | POA: Diagnosis not present

## 2018-12-13 DIAGNOSIS — H353211 Exudative age-related macular degeneration, right eye, with active choroidal neovascularization: Secondary | ICD-10-CM | POA: Diagnosis not present

## 2019-01-24 DIAGNOSIS — H353211 Exudative age-related macular degeneration, right eye, with active choroidal neovascularization: Secondary | ICD-10-CM | POA: Diagnosis not present

## 2019-01-27 DIAGNOSIS — Z0000 Encounter for general adult medical examination without abnormal findings: Secondary | ICD-10-CM | POA: Diagnosis not present

## 2019-02-24 DIAGNOSIS — I10 Essential (primary) hypertension: Secondary | ICD-10-CM | POA: Diagnosis not present

## 2019-02-24 DIAGNOSIS — E782 Mixed hyperlipidemia: Secondary | ICD-10-CM | POA: Diagnosis not present

## 2019-02-24 DIAGNOSIS — E1165 Type 2 diabetes mellitus with hyperglycemia: Secondary | ICD-10-CM | POA: Diagnosis not present

## 2019-02-24 DIAGNOSIS — E1169 Type 2 diabetes mellitus with other specified complication: Secondary | ICD-10-CM | POA: Diagnosis not present

## 2019-02-27 DIAGNOSIS — I10 Essential (primary) hypertension: Secondary | ICD-10-CM | POA: Diagnosis not present

## 2019-02-27 DIAGNOSIS — Z0001 Encounter for general adult medical examination with abnormal findings: Secondary | ICD-10-CM | POA: Diagnosis not present

## 2019-02-27 DIAGNOSIS — E782 Mixed hyperlipidemia: Secondary | ICD-10-CM | POA: Diagnosis not present

## 2019-02-27 DIAGNOSIS — I2510 Atherosclerotic heart disease of native coronary artery without angina pectoris: Secondary | ICD-10-CM | POA: Diagnosis not present

## 2019-02-27 DIAGNOSIS — E1169 Type 2 diabetes mellitus with other specified complication: Secondary | ICD-10-CM | POA: Diagnosis not present

## 2019-02-27 DIAGNOSIS — I25810 Atherosclerosis of coronary artery bypass graft(s) without angina pectoris: Secondary | ICD-10-CM | POA: Diagnosis not present

## 2019-03-07 DIAGNOSIS — H353211 Exudative age-related macular degeneration, right eye, with active choroidal neovascularization: Secondary | ICD-10-CM | POA: Diagnosis not present

## 2019-04-17 DIAGNOSIS — E782 Mixed hyperlipidemia: Secondary | ICD-10-CM | POA: Diagnosis not present

## 2019-04-17 DIAGNOSIS — I2510 Atherosclerotic heart disease of native coronary artery without angina pectoris: Secondary | ICD-10-CM | POA: Diagnosis not present

## 2019-04-17 DIAGNOSIS — I10 Essential (primary) hypertension: Secondary | ICD-10-CM | POA: Diagnosis not present

## 2019-04-17 DIAGNOSIS — E119 Type 2 diabetes mellitus without complications: Secondary | ICD-10-CM | POA: Diagnosis not present

## 2019-04-26 DIAGNOSIS — D3131 Benign neoplasm of right choroid: Secondary | ICD-10-CM | POA: Diagnosis not present

## 2019-04-26 DIAGNOSIS — H35372 Puckering of macula, left eye: Secondary | ICD-10-CM | POA: Diagnosis not present

## 2019-04-26 DIAGNOSIS — H353122 Nonexudative age-related macular degeneration, left eye, intermediate dry stage: Secondary | ICD-10-CM | POA: Diagnosis not present

## 2019-04-26 DIAGNOSIS — H353211 Exudative age-related macular degeneration, right eye, with active choroidal neovascularization: Secondary | ICD-10-CM | POA: Diagnosis not present

## 2019-06-14 DIAGNOSIS — H35372 Puckering of macula, left eye: Secondary | ICD-10-CM | POA: Diagnosis not present

## 2019-06-14 DIAGNOSIS — D3131 Benign neoplasm of right choroid: Secondary | ICD-10-CM | POA: Diagnosis not present

## 2019-06-14 DIAGNOSIS — H353231 Exudative age-related macular degeneration, bilateral, with active choroidal neovascularization: Secondary | ICD-10-CM | POA: Diagnosis not present

## 2019-06-28 DIAGNOSIS — H353231 Exudative age-related macular degeneration, bilateral, with active choroidal neovascularization: Secondary | ICD-10-CM | POA: Diagnosis not present

## 2019-07-04 DIAGNOSIS — I10 Essential (primary) hypertension: Secondary | ICD-10-CM | POA: Diagnosis not present

## 2019-07-04 DIAGNOSIS — E1169 Type 2 diabetes mellitus with other specified complication: Secondary | ICD-10-CM | POA: Diagnosis not present

## 2019-07-04 DIAGNOSIS — E782 Mixed hyperlipidemia: Secondary | ICD-10-CM | POA: Diagnosis not present

## 2019-07-11 DIAGNOSIS — I10 Essential (primary) hypertension: Secondary | ICD-10-CM | POA: Diagnosis not present

## 2019-07-11 DIAGNOSIS — E782 Mixed hyperlipidemia: Secondary | ICD-10-CM | POA: Diagnosis not present

## 2019-07-11 DIAGNOSIS — H3530 Unspecified macular degeneration: Secondary | ICD-10-CM | POA: Diagnosis not present

## 2019-07-11 DIAGNOSIS — E1169 Type 2 diabetes mellitus with other specified complication: Secondary | ICD-10-CM | POA: Diagnosis not present

## 2019-07-11 DIAGNOSIS — I25810 Atherosclerosis of coronary artery bypass graft(s) without angina pectoris: Secondary | ICD-10-CM | POA: Diagnosis not present

## 2019-07-11 DIAGNOSIS — I2510 Atherosclerotic heart disease of native coronary artery without angina pectoris: Secondary | ICD-10-CM | POA: Diagnosis not present

## 2019-07-12 ENCOUNTER — Other Ambulatory Visit (HOSPITAL_BASED_OUTPATIENT_CLINIC_OR_DEPARTMENT_OTHER): Payer: Self-pay

## 2019-07-12 DIAGNOSIS — G4733 Obstructive sleep apnea (adult) (pediatric): Secondary | ICD-10-CM

## 2019-08-02 DIAGNOSIS — D3131 Benign neoplasm of right choroid: Secondary | ICD-10-CM | POA: Diagnosis not present

## 2019-08-02 DIAGNOSIS — H35372 Puckering of macula, left eye: Secondary | ICD-10-CM | POA: Diagnosis not present

## 2019-08-02 DIAGNOSIS — H353231 Exudative age-related macular degeneration, bilateral, with active choroidal neovascularization: Secondary | ICD-10-CM | POA: Diagnosis not present

## 2019-08-09 ENCOUNTER — Other Ambulatory Visit: Payer: Self-pay

## 2019-08-09 ENCOUNTER — Ambulatory Visit: Payer: Medicare Other | Attending: Internal Medicine | Admitting: Neurology

## 2019-08-09 DIAGNOSIS — Z7984 Long term (current) use of oral hypoglycemic drugs: Secondary | ICD-10-CM | POA: Insufficient documentation

## 2019-08-09 DIAGNOSIS — G4733 Obstructive sleep apnea (adult) (pediatric): Secondary | ICD-10-CM

## 2019-08-09 DIAGNOSIS — Z7982 Long term (current) use of aspirin: Secondary | ICD-10-CM | POA: Diagnosis not present

## 2019-08-09 DIAGNOSIS — Z79899 Other long term (current) drug therapy: Secondary | ICD-10-CM | POA: Insufficient documentation

## 2019-08-13 NOTE — Procedures (Signed)
  [place]ona A. Merlene Laughter, MD     [email or web address]             HOME SLEEP TEST  LOCATION: [place]  Patient Name: [name], [name] Date: 08/09/2019 Gender: Male D.O.B: 05-01-1941 Age (years): 78 Referring Provider: Delphina Cahill Height (inches): 96 Interpreting Physician: Phillips Odor MD, ABSM Weight (lbs): 200 RPSGT: Peak, Robert BMI: 30 MRN: [id number] Neck Size: CLINICAL INFORMATION  Sleep Study Type: HST     Indication for sleep study: N/A     Epworth Sleepiness Score: NA SLEEP STUDY TECHNIQUE  A multi-channel overnight portable sleep study was performed. The channels recorded were: nasal airflow, thoracic respiratory movement, and oxygen saturation with a pulse oximetry. Snoring was also monitored.  MEDICATIONS  Current Outpatient Medications:  .  amLODipine-benazepril (LOTREL) 5-20 MG capsule, Take 1 capsule by mouth daily., Disp: , Rfl:  .  aspirin EC 81 MG tablet, Take 81 mg by mouth daily., Disp: , Rfl:  .  cetirizine (ZYRTEC) 10 MG tablet, Take 10 mg by mouth daily as needed for allergies., Disp: , Rfl:  .  glipiZIDE (GLUCOTROL) 5 MG tablet, Take 5 mg by mouth daily before breakfast., Disp: , Rfl:  .  metFORMIN (GLUCOPHAGE) 1000 MG tablet, Take 1,000 mg by mouth 2 (two) times daily with a meal., Disp: , Rfl: 3 .  nitroGLYCERIN (NITROSTAT) 0.4 MG SL tablet, Place 1 tablet (0.4 mg total) under the tongue every 5 (five) minutes as needed for chest pain., Disp: 25 tablet, Rfl: 3 .  simvastatin (ZOCOR) 10 MG tablet, Take 10 mg by mouth daily., Disp: , Rfl:    Patient self administered medications include: N/A. SLEEP ARCHITECTURE  Patient was studied for 505.5 minutes. The sleep efficiency was 91.4 % and the patient was supine for 5.7%. The arousal index was 0.0 per hour. RESPIRATORY PARAMETERS  The overall AHI was 16.7 per hour, with a central apnea index of 5.9 per hour.  The oxygen nadir was 90% during sleep.    CARDIAC DATA   Mean heart rate during sleep was 58.7 bpm.  IMPRESSIONS 1. Moderate obstructive sleep apnea occurred during this study (AHI = 16.7/h). AutoPAP 8-14 is suggested.    Delano Metz, MD Diplomate, American Board of Sleep Medicine.   ELECTRONICALLY SIGNED ON:  08/13/2019, 4:52 PM [place] PH: [phone]   FX: [phone] [place]

## 2019-09-27 DIAGNOSIS — E1169 Type 2 diabetes mellitus with other specified complication: Secondary | ICD-10-CM | POA: Diagnosis not present

## 2019-09-27 DIAGNOSIS — E782 Mixed hyperlipidemia: Secondary | ICD-10-CM | POA: Diagnosis not present

## 2019-09-27 DIAGNOSIS — I10 Essential (primary) hypertension: Secondary | ICD-10-CM | POA: Diagnosis not present

## 2019-09-27 DIAGNOSIS — I2510 Atherosclerotic heart disease of native coronary artery without angina pectoris: Secondary | ICD-10-CM | POA: Diagnosis not present

## 2019-09-27 DIAGNOSIS — H3530 Unspecified macular degeneration: Secondary | ICD-10-CM | POA: Diagnosis not present

## 2019-09-29 ENCOUNTER — Telehealth (INDEPENDENT_AMBULATORY_CARE_PROVIDER_SITE_OTHER): Payer: Medicare Other | Admitting: Cardiovascular Disease

## 2019-09-29 ENCOUNTER — Encounter: Payer: Self-pay | Admitting: Cardiovascular Disease

## 2019-09-29 VITALS — BP 110/70 | HR 96 | Ht 71.0 in | Wt 196.0 lb

## 2019-09-29 DIAGNOSIS — E119 Type 2 diabetes mellitus without complications: Secondary | ICD-10-CM

## 2019-09-29 DIAGNOSIS — R531 Weakness: Secondary | ICD-10-CM

## 2019-09-29 DIAGNOSIS — G4733 Obstructive sleep apnea (adult) (pediatric): Secondary | ICD-10-CM | POA: Diagnosis not present

## 2019-09-29 DIAGNOSIS — I25708 Atherosclerosis of coronary artery bypass graft(s), unspecified, with other forms of angina pectoris: Secondary | ICD-10-CM

## 2019-09-29 DIAGNOSIS — I10 Essential (primary) hypertension: Secondary | ICD-10-CM | POA: Diagnosis not present

## 2019-09-29 DIAGNOSIS — E785 Hyperlipidemia, unspecified: Secondary | ICD-10-CM

## 2019-09-29 DIAGNOSIS — E7800 Pure hypercholesterolemia, unspecified: Secondary | ICD-10-CM

## 2019-09-29 NOTE — Progress Notes (Signed)
Virtual Visit via Telephone Note   This visit type was conducted due to national recommendations for restrictions regarding the COVID-19 Pandemic (e.g. social distancing) in an effort to limit this patient's exposure and mitigate transmission in our community.  Due to his co-morbid illnesses, this patient is at least at moderate risk for complications without adequate follow up.  This format is felt to be most appropriate for this patient at this time.  The patient did not have access to video technology/had technical difficulties with video requiring transitioning to audio format only (telephone).  All issues noted in this document were discussed and addressed.  No physical exam could be performed with this format.  Please refer to the patient's chart for his  consent to telehealth for [facility].   Date:  09/29/2019   ID:  [name], DOB 1941/05/06, MRN [id number]  Patient Location: Home Provider Location: Home  PCP:  Celene Squibb, MD  Cardiologist:  Kate Sable, MD  Electrophysiologist:  None   Evaluation Performed:  Follow-Up Visit  Chief Complaint:  CAD  History of Present Illness:    [name] is a 79 y.o. male with a history of coronary artery disease and CABG which he had in 1999 at [facility].  He underwent a normal nuclear stress test on 03/05/2016.  Echocardiogram on 03/05/2016 showed normal left ventricular systolic function, LVEF 50 to 55%, and mild diastolic dysfunction.  His wife is also my patient.  The patient denies any symptoms of chest pain, palpitations, shortness of breath, and leg swelling.  He's been feeling weak for the past year. He has developed bladder incontinence.  He has moderate OSA and had a sleep study on 08/09/19.   Past Medical History:  Diagnosis Date  . Cancer ([place])   . Hypertension    Past Surgical History:  Procedure Laterality Date  . COLONOSCOPY N/A 02/09/2017   Procedure: COLONOSCOPY;  Surgeon: Aviva Signs, MD;   [facility];  Service: Gastroenterology;  Laterality: N/A;     Current Meds  Medication Sig  . amLODipine-benazepril (LOTREL) 5-20 MG capsule Take 1 capsule by mouth daily.  Marland Kitchen aspirin EC 81 MG tablet Take 81 mg by mouth daily.  . cetirizine (ZYRTEC) 10 MG tablet Take 10 mg by mouth daily as needed for allergies.  . metFORMIN (GLUCOPHAGE) 1000 MG tablet Take 1,000 mg by mouth 2 (two) times daily with a meal.  . nitroGLYCERIN (NITROSTAT) 0.4 MG SL tablet Place 1 tablet (0.4 mg total) under the tongue every 5 (five) minutes as needed for chest pain.  . simvastatin (ZOCOR) 10 MG tablet Take 10 mg by mouth daily.  . [DISCONTINUED] glipiZIDE (GLUCOTROL) 5 MG tablet Take 5 mg by mouth daily before breakfast.     Allergies:   Patient has no known allergies.   Social History   Tobacco Use  . Smoking status: Former Smoker    Packs/day: 1.00    Types: Cigarettes    Quit date: 02/25/1971    Years since quitting: 48.6  . Smokeless tobacco: Former Systems developer    Types: Snuff  Substance Use Topics  . Alcohol use: No    Alcohol/week: 0.0 standard drinks  . Drug use: No     Family Hx: The patient's family history includes Dementia in his mother; Diabetes in his father.  ROS:   Please see the history of present illness.     All other systems reviewed and are negative.   Prior CV studies:  The following studies were reviewed today:  Reviewed above  Labs/Other Tests and Data Reviewed:    EKG:  No ECG reviewed.  Recent Labs: No results found for requested labs within last 8760 hours.   Recent Lipid Panel No results found for: CHOL, TRIG, HDL, CHOLHDL, LDLCALC, LDLDIRECT  Wt Readings from Last 3 Encounters:  09/29/19 196 lb (88.9 kg)  09/23/18 200 lb (90.7 kg)  09/06/17 201 lb (91.2 kg)     Objective:    Vital Signs:  BP 110/70   Pulse 96   Ht 5\' 11"  (1.803 m)   Wt 196 lb (88.9 kg)   BMI 27.34 kg/m    VITAL SIGNS:  reviewed  ASSESSMENT & PLAN:    1.  Coronary  artery disease: History of CABG. symptomatically stable.  Normal nuclear stress test on 03/05/2016 with echocardiogram at that time demonstrated normal left ventricular systolic function and mild diastolic dysfunction.  Currently on aspirin and simvastatin.   2.  Hypertension: Blood pressure is normal.  No changes to therapy.  3.  Hypercholesterolemia: Currently on simvastatin.   4.  Type 2 diabetes mellitus: Currently on metformin.  5. OSA: He has moderate OSA and had a sleep study on 08/09/19. He has refused CPAP.  6. Weakness and fatigue: This is currently being worked up by his PCP. He is apparently running some blood tests and urinalysis.   COVID-19 Education: The signs and symptoms of COVID-19 were discussed with the patient and how to seek care for testing (follow up with PCP or arrange E-visit).  The importance of social distancing was discussed today.  Time:   Today, I have spent 10 minutes with the patient with telehealth technology discussing the above problems.     Medication Adjustments/Labs and Tests Ordered: Current medicines are reviewed at length with the patient today.  Concerns regarding medicines are outlined above.   Tests Ordered: No orders of the defined types were placed in this encounter.   Medication Changes: No orders of the defined types were placed in this encounter.   Follow Up:  Virtual Visit  in 1 year(s)  Signed, Kate Sable, MD  09/29/2019 1:12 PM    [facility]

## 2019-09-29 NOTE — Patient Instructions (Signed)
Your physician wants you to follow-up in: 1 YEAR WITH DR KONESWARAN You will receive a reminder letter in the mail two months in advance. If you don't receive a letter, please call our office to schedule the follow-up appointment.  Your physician recommends that you continue on your current medications as directed. Please refer to the Current Medication list given to you today.  Thank you for choosing [facility]!!    

## 2019-09-30 DIAGNOSIS — R5383 Other fatigue: Secondary | ICD-10-CM | POA: Diagnosis not present

## 2019-09-30 DIAGNOSIS — I10 Essential (primary) hypertension: Secondary | ICD-10-CM | POA: Diagnosis not present

## 2019-09-30 DIAGNOSIS — Z7982 Long term (current) use of aspirin: Secondary | ICD-10-CM | POA: Diagnosis not present

## 2019-09-30 DIAGNOSIS — E119 Type 2 diabetes mellitus without complications: Secondary | ICD-10-CM | POA: Diagnosis not present

## 2019-09-30 DIAGNOSIS — Z7984 Long term (current) use of oral hypoglycemic drugs: Secondary | ICD-10-CM | POA: Diagnosis not present

## 2019-09-30 DIAGNOSIS — R296 Repeated falls: Secondary | ICD-10-CM | POA: Diagnosis not present

## 2019-09-30 DIAGNOSIS — Z9181 History of falling: Secondary | ICD-10-CM | POA: Diagnosis not present

## 2019-09-30 DIAGNOSIS — E782 Mixed hyperlipidemia: Secondary | ICD-10-CM | POA: Diagnosis not present

## 2019-09-30 DIAGNOSIS — I2510 Atherosclerotic heart disease of native coronary artery without angina pectoris: Secondary | ICD-10-CM | POA: Diagnosis not present

## 2019-09-30 DIAGNOSIS — H353230 Exudative age-related macular degeneration, bilateral, stage unspecified: Secondary | ICD-10-CM | POA: Diagnosis not present

## 2019-09-30 DIAGNOSIS — R260 Ataxic gait: Secondary | ICD-10-CM | POA: Diagnosis not present

## 2019-10-03 DIAGNOSIS — R260 Ataxic gait: Secondary | ICD-10-CM | POA: Diagnosis not present

## 2019-10-03 DIAGNOSIS — Z7982 Long term (current) use of aspirin: Secondary | ICD-10-CM | POA: Diagnosis not present

## 2019-10-03 DIAGNOSIS — I2510 Atherosclerotic heart disease of native coronary artery without angina pectoris: Secondary | ICD-10-CM | POA: Diagnosis not present

## 2019-10-03 DIAGNOSIS — E782 Mixed hyperlipidemia: Secondary | ICD-10-CM | POA: Diagnosis not present

## 2019-10-03 DIAGNOSIS — H353230 Exudative age-related macular degeneration, bilateral, stage unspecified: Secondary | ICD-10-CM | POA: Diagnosis not present

## 2019-10-03 DIAGNOSIS — I10 Essential (primary) hypertension: Secondary | ICD-10-CM | POA: Diagnosis not present

## 2019-10-03 DIAGNOSIS — Z7984 Long term (current) use of oral hypoglycemic drugs: Secondary | ICD-10-CM | POA: Diagnosis not present

## 2019-10-03 DIAGNOSIS — R5383 Other fatigue: Secondary | ICD-10-CM | POA: Diagnosis not present

## 2019-10-03 DIAGNOSIS — E119 Type 2 diabetes mellitus without complications: Secondary | ICD-10-CM | POA: Diagnosis not present

## 2019-10-03 DIAGNOSIS — R296 Repeated falls: Secondary | ICD-10-CM | POA: Diagnosis not present

## 2019-10-03 DIAGNOSIS — Z9181 History of falling: Secondary | ICD-10-CM | POA: Diagnosis not present

## 2019-10-06 DIAGNOSIS — E119 Type 2 diabetes mellitus without complications: Secondary | ICD-10-CM | POA: Diagnosis not present

## 2019-10-06 DIAGNOSIS — I10 Essential (primary) hypertension: Secondary | ICD-10-CM | POA: Diagnosis not present

## 2019-10-06 DIAGNOSIS — E782 Mixed hyperlipidemia: Secondary | ICD-10-CM | POA: Diagnosis not present

## 2019-10-06 DIAGNOSIS — R5383 Other fatigue: Secondary | ICD-10-CM | POA: Diagnosis not present

## 2019-10-06 DIAGNOSIS — R260 Ataxic gait: Secondary | ICD-10-CM | POA: Diagnosis not present

## 2019-10-06 DIAGNOSIS — H353230 Exudative age-related macular degeneration, bilateral, stage unspecified: Secondary | ICD-10-CM | POA: Diagnosis not present

## 2019-10-06 DIAGNOSIS — Z7982 Long term (current) use of aspirin: Secondary | ICD-10-CM | POA: Diagnosis not present

## 2019-10-06 DIAGNOSIS — Z9181 History of falling: Secondary | ICD-10-CM | POA: Diagnosis not present

## 2019-10-06 DIAGNOSIS — R296 Repeated falls: Secondary | ICD-10-CM | POA: Diagnosis not present

## 2019-10-06 DIAGNOSIS — I2510 Atherosclerotic heart disease of native coronary artery without angina pectoris: Secondary | ICD-10-CM | POA: Diagnosis not present

## 2019-10-06 DIAGNOSIS — Z7984 Long term (current) use of oral hypoglycemic drugs: Secondary | ICD-10-CM | POA: Diagnosis not present

## 2019-10-10 DIAGNOSIS — R260 Ataxic gait: Secondary | ICD-10-CM | POA: Diagnosis not present

## 2019-10-10 DIAGNOSIS — E782 Mixed hyperlipidemia: Secondary | ICD-10-CM | POA: Diagnosis not present

## 2019-10-10 DIAGNOSIS — Z9181 History of falling: Secondary | ICD-10-CM | POA: Diagnosis not present

## 2019-10-10 DIAGNOSIS — Z7982 Long term (current) use of aspirin: Secondary | ICD-10-CM | POA: Diagnosis not present

## 2019-10-10 DIAGNOSIS — R296 Repeated falls: Secondary | ICD-10-CM | POA: Diagnosis not present

## 2019-10-10 DIAGNOSIS — R5383 Other fatigue: Secondary | ICD-10-CM | POA: Diagnosis not present

## 2019-10-10 DIAGNOSIS — Z7984 Long term (current) use of oral hypoglycemic drugs: Secondary | ICD-10-CM | POA: Diagnosis not present

## 2019-10-10 DIAGNOSIS — I2510 Atherosclerotic heart disease of native coronary artery without angina pectoris: Secondary | ICD-10-CM | POA: Diagnosis not present

## 2019-10-10 DIAGNOSIS — E119 Type 2 diabetes mellitus without complications: Secondary | ICD-10-CM | POA: Diagnosis not present

## 2019-10-10 DIAGNOSIS — H353230 Exudative age-related macular degeneration, bilateral, stage unspecified: Secondary | ICD-10-CM | POA: Diagnosis not present

## 2019-10-10 DIAGNOSIS — I10 Essential (primary) hypertension: Secondary | ICD-10-CM | POA: Diagnosis not present

## 2019-10-13 DIAGNOSIS — I2510 Atherosclerotic heart disease of native coronary artery without angina pectoris: Secondary | ICD-10-CM | POA: Diagnosis not present

## 2019-10-13 DIAGNOSIS — Z7984 Long term (current) use of oral hypoglycemic drugs: Secondary | ICD-10-CM | POA: Diagnosis not present

## 2019-10-13 DIAGNOSIS — Z7982 Long term (current) use of aspirin: Secondary | ICD-10-CM | POA: Diagnosis not present

## 2019-10-13 DIAGNOSIS — E119 Type 2 diabetes mellitus without complications: Secondary | ICD-10-CM | POA: Diagnosis not present

## 2019-10-13 DIAGNOSIS — E782 Mixed hyperlipidemia: Secondary | ICD-10-CM | POA: Diagnosis not present

## 2019-10-13 DIAGNOSIS — R5383 Other fatigue: Secondary | ICD-10-CM | POA: Diagnosis not present

## 2019-10-13 DIAGNOSIS — Z9181 History of falling: Secondary | ICD-10-CM | POA: Diagnosis not present

## 2019-10-13 DIAGNOSIS — I10 Essential (primary) hypertension: Secondary | ICD-10-CM | POA: Diagnosis not present

## 2019-10-13 DIAGNOSIS — R260 Ataxic gait: Secondary | ICD-10-CM | POA: Diagnosis not present

## 2019-10-13 DIAGNOSIS — R296 Repeated falls: Secondary | ICD-10-CM | POA: Diagnosis not present

## 2019-10-13 DIAGNOSIS — H353230 Exudative age-related macular degeneration, bilateral, stage unspecified: Secondary | ICD-10-CM | POA: Diagnosis not present

## 2019-10-16 DIAGNOSIS — Z7984 Long term (current) use of oral hypoglycemic drugs: Secondary | ICD-10-CM | POA: Diagnosis not present

## 2019-10-16 DIAGNOSIS — R296 Repeated falls: Secondary | ICD-10-CM | POA: Diagnosis not present

## 2019-10-16 DIAGNOSIS — E782 Mixed hyperlipidemia: Secondary | ICD-10-CM | POA: Diagnosis not present

## 2019-10-16 DIAGNOSIS — R260 Ataxic gait: Secondary | ICD-10-CM | POA: Diagnosis not present

## 2019-10-16 DIAGNOSIS — H353230 Exudative age-related macular degeneration, bilateral, stage unspecified: Secondary | ICD-10-CM | POA: Diagnosis not present

## 2019-10-16 DIAGNOSIS — R5383 Other fatigue: Secondary | ICD-10-CM | POA: Diagnosis not present

## 2019-10-16 DIAGNOSIS — Z7982 Long term (current) use of aspirin: Secondary | ICD-10-CM | POA: Diagnosis not present

## 2019-10-16 DIAGNOSIS — E119 Type 2 diabetes mellitus without complications: Secondary | ICD-10-CM | POA: Diagnosis not present

## 2019-10-16 DIAGNOSIS — I2510 Atherosclerotic heart disease of native coronary artery without angina pectoris: Secondary | ICD-10-CM | POA: Diagnosis not present

## 2019-10-16 DIAGNOSIS — I10 Essential (primary) hypertension: Secondary | ICD-10-CM | POA: Diagnosis not present

## 2019-10-16 DIAGNOSIS — Z9181 History of falling: Secondary | ICD-10-CM | POA: Diagnosis not present

## 2019-10-18 DIAGNOSIS — R296 Repeated falls: Secondary | ICD-10-CM | POA: Diagnosis not present

## 2019-10-18 DIAGNOSIS — Z9181 History of falling: Secondary | ICD-10-CM | POA: Diagnosis not present

## 2019-10-18 DIAGNOSIS — I2510 Atherosclerotic heart disease of native coronary artery without angina pectoris: Secondary | ICD-10-CM | POA: Diagnosis not present

## 2019-10-18 DIAGNOSIS — Z7984 Long term (current) use of oral hypoglycemic drugs: Secondary | ICD-10-CM | POA: Diagnosis not present

## 2019-10-18 DIAGNOSIS — E119 Type 2 diabetes mellitus without complications: Secondary | ICD-10-CM | POA: Diagnosis not present

## 2019-10-18 DIAGNOSIS — R5383 Other fatigue: Secondary | ICD-10-CM | POA: Diagnosis not present

## 2019-10-18 DIAGNOSIS — R260 Ataxic gait: Secondary | ICD-10-CM | POA: Diagnosis not present

## 2019-10-18 DIAGNOSIS — I10 Essential (primary) hypertension: Secondary | ICD-10-CM | POA: Diagnosis not present

## 2019-10-18 DIAGNOSIS — Z7982 Long term (current) use of aspirin: Secondary | ICD-10-CM | POA: Diagnosis not present

## 2019-10-18 DIAGNOSIS — H353230 Exudative age-related macular degeneration, bilateral, stage unspecified: Secondary | ICD-10-CM | POA: Diagnosis not present

## 2019-10-18 DIAGNOSIS — E782 Mixed hyperlipidemia: Secondary | ICD-10-CM | POA: Diagnosis not present

## 2019-10-20 DIAGNOSIS — Z1321 Encounter for screening for nutritional disorder: Secondary | ICD-10-CM | POA: Diagnosis not present

## 2019-10-20 DIAGNOSIS — I10 Essential (primary) hypertension: Secondary | ICD-10-CM | POA: Diagnosis not present

## 2019-10-20 DIAGNOSIS — E782 Mixed hyperlipidemia: Secondary | ICD-10-CM | POA: Diagnosis not present

## 2019-10-20 DIAGNOSIS — E119 Type 2 diabetes mellitus without complications: Secondary | ICD-10-CM | POA: Diagnosis not present

## 2019-10-20 DIAGNOSIS — R7301 Impaired fasting glucose: Secondary | ICD-10-CM | POA: Diagnosis not present

## 2019-10-20 DIAGNOSIS — E559 Vitamin D deficiency, unspecified: Secondary | ICD-10-CM | POA: Diagnosis not present

## 2019-10-20 DIAGNOSIS — E1169 Type 2 diabetes mellitus with other specified complication: Secondary | ICD-10-CM | POA: Diagnosis not present

## 2019-10-25 DIAGNOSIS — I10 Essential (primary) hypertension: Secondary | ICD-10-CM | POA: Diagnosis not present

## 2019-10-25 DIAGNOSIS — E1169 Type 2 diabetes mellitus with other specified complication: Secondary | ICD-10-CM | POA: Diagnosis not present

## 2019-10-25 DIAGNOSIS — I2510 Atherosclerotic heart disease of native coronary artery without angina pectoris: Secondary | ICD-10-CM | POA: Diagnosis not present

## 2019-10-25 DIAGNOSIS — E782 Mixed hyperlipidemia: Secondary | ICD-10-CM | POA: Diagnosis not present

## 2019-10-25 DIAGNOSIS — H3530 Unspecified macular degeneration: Secondary | ICD-10-CM | POA: Diagnosis not present

## 2019-10-26 ENCOUNTER — Other Ambulatory Visit: Payer: Self-pay | Admitting: Internal Medicine

## 2019-10-26 ENCOUNTER — Other Ambulatory Visit (HOSPITAL_COMMUNITY): Payer: Self-pay | Admitting: Internal Medicine

## 2019-10-26 DIAGNOSIS — R634 Abnormal weight loss: Secondary | ICD-10-CM

## 2019-10-26 DIAGNOSIS — Z8546 Personal history of malignant neoplasm of prostate: Secondary | ICD-10-CM

## 2019-10-27 ENCOUNTER — Other Ambulatory Visit (HOSPITAL_COMMUNITY): Payer: Self-pay | Admitting: Internal Medicine

## 2019-10-27 ENCOUNTER — Other Ambulatory Visit: Payer: Self-pay | Admitting: Internal Medicine

## 2019-11-07 ENCOUNTER — Telehealth: Payer: Self-pay | Admitting: Neurology

## 2019-11-07 ENCOUNTER — Encounter: Payer: Self-pay | Admitting: Neurology

## 2019-11-07 ENCOUNTER — Other Ambulatory Visit: Payer: Self-pay

## 2019-11-07 ENCOUNTER — Ambulatory Visit: Payer: Medicare Other | Admitting: Neurology

## 2019-11-07 VITALS — BP 143/81 | HR 91 | Temp 97.6°F | Ht 71.0 in | Wt 188.5 lb

## 2019-11-07 DIAGNOSIS — R7989 Other specified abnormal findings of blood chemistry: Secondary | ICD-10-CM | POA: Diagnosis not present

## 2019-11-07 DIAGNOSIS — R269 Unspecified abnormalities of gait and mobility: Secondary | ICD-10-CM

## 2019-11-07 DIAGNOSIS — W19XXXS Unspecified fall, sequela: Secondary | ICD-10-CM

## 2019-11-07 DIAGNOSIS — E538 Deficiency of other specified B group vitamins: Secondary | ICD-10-CM | POA: Diagnosis not present

## 2019-11-07 DIAGNOSIS — W19XXXA Unspecified fall, initial encounter: Secondary | ICD-10-CM | POA: Insufficient documentation

## 2019-11-07 DIAGNOSIS — R799 Abnormal finding of blood chemistry, unspecified: Secondary | ICD-10-CM | POA: Diagnosis not present

## 2019-11-07 DIAGNOSIS — G3281 Cerebellar ataxia in diseases classified elsewhere: Secondary | ICD-10-CM | POA: Diagnosis not present

## 2019-11-07 DIAGNOSIS — R32 Unspecified urinary incontinence: Secondary | ICD-10-CM | POA: Diagnosis not present

## 2019-11-07 NOTE — Telephone Encounter (Signed)
[employer] medicare order sent to GI. No auth they will reach out to the patient to schedule.  

## 2019-11-07 NOTE — Progress Notes (Signed)
PATIENT: [name] DOB: 1940/10/20  Chief Complaint  Patient presents with  . r/o Parkinson's Disease    He is here with his wife, [name]. Reports a slow, shuffling gait over the last 5-6 months. He is easily fatigued.  He has tremors in his bilateral hands. He also had a ten day perior of being incontinent of urine but this has improved. He has experienced unplanned weight loss of about ten pounds. His walking improved some after having PT for 3-4 weeks.   Marland Kitchen PCP    Celene Squibb, MD     HISTORICAL  [name] is a 79 year old male, seen in request by his primary care physician Dr. Allyn Kenner for evaluation of gait abnormality, he is accompanied by his wife at today's clinical visit on November 07, 2019.  I have reviewed and summarized the referring note from the referring physician.  He had a past medical history of hypertension, hyperlipidemia, diabetes, chronic artery disease,  He was previously very active, "very physical", enjoying his yard work in the summertime, around November 2020, he noticed gradual onset gait abnormality, fell few times, acute worsening since he fell at the end of December 2020, he fell in the bathtub on his back, following that fall, he had 2 weeks history of urinary incontinence, now gradually improved, but his gait abnormality remains, he was noted to have small stride, careful, stiff gait,  He denied loss of sense of smell, denies REM sleep disorder or memory loss, denies hand tremor, denies bilateral upper or lower extremity paresthesia, denies significant pain, he denies double vision, swallowing difficulty, ptosis,  Laboratory evaluations in January 2021, normal CBC hemoglobin of 13.9, CMP, creatinine of 0.94, LDL was 68, A1c was 7.0,  REVIEW OF SYSTEMS: Full 14 system review of systems performed and notable only for as above All other review of systems were negative.  ALLERGIES: No Known Allergies  HOME MEDICATIONS: Current Outpatient  Medications  Medication Sig Dispense Refill  . amLODipine-benazepril (LOTREL) 5-20 MG capsule Take 1 capsule by mouth daily.    Marland Kitchen aspirin EC 81 MG tablet Take 81 mg by mouth daily.    . cetirizine (ZYRTEC) 10 MG tablet Take 10 mg by mouth daily as needed for allergies.    . metFORMIN (GLUCOPHAGE) 1000 MG tablet Take 1,000 mg by mouth 2 (two) times daily with a meal.  3  . simvastatin (ZOCOR) 10 MG tablet Take 10 mg by mouth daily.    . nitroGLYCERIN (NITROSTAT) 0.4 MG SL tablet Place 1 tablet (0.4 mg total) under the tongue every 5 (five) minutes as needed for chest pain. 25 tablet 3   No current facility-administered medications for this visit.    PAST MEDICAL HISTORY: Past Medical History:  Diagnosis Date  . Abnormal gait   . Fatigue   . Hypertension   . Prostate cancer ([place])   . Tremor   . Vitamin D deficiency     PAST SURGICAL HISTORY: Past Surgical History:  Procedure Laterality Date  . CARDIAC SURGERY     bypass  . COLONOSCOPY N/A 02/09/2017   Procedure: COLONOSCOPY;  Surgeon: Aviva Signs, MD;  [facility];  Service: Gastroenterology;  Laterality: N/A;  . HEMORROIDECTOMY    . KNEE SURGERY Right     FAMILY HISTORY: Family History  Problem Relation Age of Onset  . Dementia Mother   . Diabetes Father   . Lung cancer Father   . Breast cancer Sister   .  Multiple myeloma Brother     SOCIAL HISTORY: Social History   Socioeconomic History  . Marital status: Married    Spouse name: Not on file  . Number of children: 1  . Years of education: 57  . Highest education level: High school graduate  Occupational History  . Occupation: Retired  Tobacco Use  . Smoking status: Former Smoker    Packs/day: 1.00    Types: Cigarettes    Quit date: 02/25/1971    Years since quitting: 48.7  . Smokeless tobacco: Former Systems developer    Types: Snuff  Substance and Sexual Activity  . Alcohol use: No    Alcohol/week: 0.0 standard drinks  . Drug use: No  . Sexual  activity: Not on file  Other Topics Concern  . Not on file  Social History Narrative   Lives at home with his wife.   Right-handed.   2 cups caffeine per day.   Social Determinants of Health   Financial Resource Strain:   . Difficulty of Paying Living Expenses: Not on file  Food Insecurity:   . Worried About Charity fundraiser in the Last Year: Not on file  . Ran Out of Food in the Last Year: Not on file  Transportation Needs:   . Lack of Transportation (Medical): Not on file  . Lack of Transportation (Non-Medical): Not on file  Physical Activity:   . Days of Exercise per Week: Not on file  . Minutes of Exercise per Session: Not on file  Stress:   . Feeling of Stress : Not on file  Social Connections:   . Frequency of Communication with Friends and Family: Not on file  . Frequency of Social Gatherings with Friends and Family: Not on file  . Attends Religious Services: Not on file  . Active Member of Clubs or Organizations: Not on file  . Attends Archivist Meetings: Not on file  . Marital Status: Not on file  Intimate Partner Violence:   . Fear of Current or Ex-Partner: Not on file  . Emotionally Abused: Not on file  . Physically Abused: Not on file  . Sexually Abused: Not on file     PHYSICAL EXAM   Vitals:   11/07/19 0905  BP: (!) 143/81  Pulse: 91  Temp: 97.6 F (36.4 C)  Weight: 188 lb 8 oz (85.5 kg)  Height: 5' 11"  (1.803 m)    Not recorded      Body mass index is 26.29 kg/m.  PHYSICAL EXAMNIATION:  Gen: NAD, conversant, well nourised, well groomed                     Cardiovascular: Regular rate rhythm, no peripheral edema, warm, nontender. Eyes: Conjunctivae clear without exudates or hemorrhage Neck: Supple, no carotid bruits. Pulmonary: Clear to auscultation bilaterally   NEUROLOGICAL EXAM:  MENTAL STATUS: Speech:    Speech is normal; fluent and spontaneous with normal comprehension.  Cognition:     Orientation to time, place  and person     Normal recent and remote memory     Normal Attention span and concentration     Normal Language, naming, repeating,spontaneous speech     Fund of knowledge   CRANIAL NERVES: CN II: Visual fields are full to confrontation. Pupils are round equal and briskly reactive to light. CN III, IV, VI: extraocular movement are normal. No ptosis. CN V: Facial sensation is intact to light touch CN VII: Face is symmetric with normal eye  closure  CN VIII: Hard of hearing CN IX, X: Phonation is normal. CN XI: Head turning and shoulder shrug are intact  MOTOR: There was no resting tremor, normal tone, he has mild bilateral shoulder abduction, external rotation, elbow flexion weakness, mild to moderate bilateral hip flexion weakness,  REFLEXES: Reflexes are 2+ and symmetric at the biceps, triceps, 3/3 knees, and absent at ankles. Plantar responses are extensor bilaterally  SENSORY: Length dependent decreased to light touch, pinprick and vibratory sensation to ankle level COORDINATION: There is no trunk or limb dysmetria noted.  GAIT/STANCE: He needs push-up to get up from seated position, leaning forward, stiff, wide-based cautious, unsteady gait  DIAGNOSTIC DATA (LABS, IMAGING, TESTING) - I reviewed patient records, labs, notes, testing and imaging myself where available.   ASSESSMENT AND PLAN  [name] is a 79 y.o. male   Subacute onset slow worsening gait abnormality, which has made worse since he fell in December 2020, also suffered 2 weeks history of urinary incontinence following his fall  On examination, he has mild to moderate bilateral upper and lower extremity proximal muscle weakness, hyperreflexia, bilateral Babinski signs, length dependent sensory changes, unsteady gait,  Need to rule out cervical spondylitic myelopathy, central nervous system etiology, differentiation diagnoses also include peripheral neuropathy  Proceed with MRI of the brain, cervical  spine,  EMG nerve conduction study  Laboratory evaluations, including CPK, acetylcholine binding receptor antibody   Marcial Pacas, M.D. Ph.D.  [facility] [address] [place], [place] [address] Ph: [phone] Fax: [phone]  CC: Celene Squibb, MD

## 2019-11-08 DIAGNOSIS — D3131 Benign neoplasm of right choroid: Secondary | ICD-10-CM | POA: Diagnosis not present

## 2019-11-08 DIAGNOSIS — H35372 Puckering of macula, left eye: Secondary | ICD-10-CM | POA: Diagnosis not present

## 2019-11-08 DIAGNOSIS — H353231 Exudative age-related macular degeneration, bilateral, with active choroidal neovascularization: Secondary | ICD-10-CM | POA: Diagnosis not present

## 2019-11-11 LAB — TSH: TSH: 1.39 u[IU]/mL (ref 0.450–4.500)

## 2019-11-11 LAB — SEDIMENTATION RATE: Sed Rate: 12 mm/hr (ref 0–30)

## 2019-11-11 LAB — FOLATE: Folate: 14.6 ng/mL (ref >3.0)

## 2019-11-11 LAB — VITAMIN B12: Vitamin B-12: 284 pg/mL (ref 232–1245)

## 2019-11-11 LAB — ACETYLCHOLINE RECEPTOR, BINDING: AChR Binding Ab, Serum: <0.03 nmol/L (ref 0.00–0.24)

## 2019-11-11 LAB — RPR: RPR Ser Ql: NONREACTIVE

## 2019-11-11 LAB — ANA W/REFLEX IF POSITIVE: Anti Nuclear Antibody (ANA): NEGATIVE

## 2019-11-11 LAB — C-REACTIVE PROTEIN: CRP: 4 mg/L (ref 0–10)

## 2019-11-11 LAB — CK: Total CK: 68 U/L (ref 41–331)

## 2019-11-13 ENCOUNTER — Telehealth: Payer: Self-pay | Admitting: *Deleted

## 2019-11-13 NOTE — Telephone Encounter (Signed)
Left patient a message letting him know his lab work was normal. Provided our number to call back with any questions.

## 2019-11-13 NOTE — Telephone Encounter (Signed)
-----   Message from Marcial Pacas, MD sent at 11/13/2019  1:03 PM EST ----- Please call patient for normal laboratory result

## 2019-11-14 ENCOUNTER — Other Ambulatory Visit: Payer: Self-pay

## 2019-11-14 NOTE — Patient Outreach (Signed)
[facility]) Care Management  11/14/2019  [name] 09-05-1941 [id number]   Medication Adherence call to Mr. [name] [facility] Identifiers Verify spoke with patient he is past due on Atorvastatin 20 mg,patient explain he takes 1 tablet daily,patient said there is a prescription ready at the pharmacy that will pick up today. Mr,[name] is showing past due under [facility].  [place] Management Direct Dial [phone]  Fax [phone] [email or web address]

## 2019-11-20 ENCOUNTER — Ambulatory Visit (INDEPENDENT_AMBULATORY_CARE_PROVIDER_SITE_OTHER): Payer: Medicare Other | Admitting: Neurology

## 2019-11-20 ENCOUNTER — Encounter: Payer: Medicare Other | Admitting: Neurology

## 2019-11-20 ENCOUNTER — Other Ambulatory Visit: Payer: Self-pay

## 2019-11-20 DIAGNOSIS — W19XXXS Unspecified fall, sequela: Secondary | ICD-10-CM

## 2019-11-20 DIAGNOSIS — R202 Paresthesia of skin: Secondary | ICD-10-CM

## 2019-11-20 DIAGNOSIS — G3281 Cerebellar ataxia in diseases classified elsewhere: Secondary | ICD-10-CM

## 2019-11-20 DIAGNOSIS — R32 Unspecified urinary incontinence: Secondary | ICD-10-CM

## 2019-11-20 DIAGNOSIS — R269 Unspecified abnormalities of gait and mobility: Secondary | ICD-10-CM

## 2019-11-20 DIAGNOSIS — Z0289 Encounter for other administrative examinations: Secondary | ICD-10-CM

## 2019-11-20 NOTE — Procedures (Signed)
Full Name: Carwin Masri Gender: Male MRN #: [id number] Date of Birth: 1940-12-12    Visit Date: 11/20/2019 11:01 Age: 79 Years Examining Physician: Marcial Pacas, MD  Referring Physician: Marcial Pacas, MD History:   79 year old male with history of diabetes, presenting with bilateral feet paresthesia, gradual onset gait abnormality  Summary of the tests:  Nerve conduction study:  Bilateral sural, superficial peroneal sensory responses showed mildly prolonged peak latency with mild to moderately decreased snap amplitude  Bilateral tibial motor responses showed mildly decreased CMAP amplitude, with mildly slow conduction velocity.  Bilateral peroneal to EDB motor responses also showed mildly slow conduction velocity.  Left ulnar sensory and motor responses were normal  Electromyography:  Selected needle examination of right upper, lower extremity muscles, and right cervical, lumbar sacral paraspinal muscles were normal   Conclusion: This is an abnormal study.  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  There is no evidence of right cervical and lumbosacral radiculopathy.    ------------------------------- Marcial Pacas, M.D. PhD  [facility] [place], [place] [address] Tel: [phone] Fax: [phone]         [facility]    Nerve / Sites Muscle Latency Ref. Amplitude Ref. Rel Amp Segments Distance Velocity Ref. Area    ms ms mV mV %  cm m/s m/s mVms  L Ulnar - ADM     Wrist ADM 3.6 ?3.3 4.1 ?6.0 100 Wrist - ADM 7   4.7     B.Elbow ADM 7.9  7.0  170 B.Elbow - Wrist 22 51 ?49 20.1     A.Elbow ADM 10.4  6.3  89.9 A.Elbow - B.Elbow 10 40 ?49 20.1         A.Elbow - Wrist      R Peroneal - EDB     Ankle EDB 5.6 ?6.5 3.7 ?2.0 100 Ankle - EDB 9   13.4     Fib head EDB 13.9  3.2  86.1 Fib head - Ankle 31 37 ?44 12.3     Pop fossa EDB 16.5  3.2  102 Pop fossa - Fib head 10 39 ?44 12.8         Pop fossa - Ankle        L Peroneal - EDB     Ankle EDB 5.6 ?6.5 3.7 ?2.0 100 Ankle - EDB 9   11.3     Fib head EDB 13.7  2.8  77 Fib head - Ankle 30 37 ?44 10.2     Pop fossa EDB 16.2  2.6  93.3 Pop fossa - Fib head 10 40 ?44 9.7         Pop fossa - Ankle      R Tibial - AH     Ankle AH 4.2 ?5.8 2.9 ?4.0 100 Ankle - AH 9   14.8     Pop fossa AH 16.5  2.2  76.1 Pop fossa - Ankle 40 33 ?41 10.4  L Tibial - AH     Ankle AH 4.6 ?5.8 3.6 ?4.0 100 Ankle - AH 9   14.6     Pop fossa AH 16.4  3.0  81.5 Pop fossa - Ankle 40 34 ?41 10.4                SNC    Nerve / Sites Rec. Site Peak Lat Ref.  Amp Ref. Segments Distance    ms ms V V  cm  R  Sural - Ankle (Calf)     Calf Ankle 4.9 ?4.4 2 ?6 Calf - Ankle 14  L Sural - Ankle (Calf)     Calf Ankle 5.0 ?4.4 2 ?6 Calf - Ankle 14  R Superficial peroneal - Ankle     Lat leg Ankle 5.2 ?4.4 3 ?6 Lat leg - Ankle 14  L Superficial peroneal - Ankle     Lat leg Ankle 4.8 ?4.4 1 ?6 Lat leg - Ankle 14  L Ulnar - Orthodromic, (Dig V, Mid palm)     Dig V Wrist 3.1 ?3.1 6 ?5 Dig V - Wrist 10               F  Wave    Nerve F Lat Ref.   ms ms  R Tibial - AH 64.8 ?56.0  L Tibial - AH 66.9 ?56.0  L Ulnar - ADM 30.1 ?32.0           EMG Summary Table    Spontaneous MUAP Recruitment  Muscle IA Fib PSW Fasc Other Amp Dur. Poly Pattern  R. Tibialis anterior Normal None None None _______ Normal Normal Normal Normal  R. Tibialis posterior Normal None None None _______ Normal Normal Normal Normal  R. Peroneus longus Normal None None None _______ Normal Normal Normal Normal  R. Gastrocnemius (Medial head) Normal None None None _______ Normal Normal Normal Normal  R. Vastus lateralis Normal None None None _______ Normal Normal Normal Normal  R. Lumbar paraspinals (mid) Normal None None None _______ Normal Normal Normal Normal  R. Lumbar paraspinals (low) Normal None None None _______ Normal Normal Normal Normal  R. First dorsal interosseous Normal None None None _______ Normal  Normal Normal Normal  R. Pronator teres Normal None None None _______ Normal Normal Normal Normal  R. Biceps brachii Normal None None None _______ Normal Normal Normal Normal  R. Deltoid Normal None None None _______ Normal Normal Normal Normal  R. Triceps brachii Normal None None None _______ Normal Normal Normal Normal  R. Extensor digitorum communis Normal None None None _______ Normal Normal Normal Normal  R. Cervical paraspinals Normal None None None _______ Normal Normal Normal Normal

## 2019-11-21 DIAGNOSIS — I10 Essential (primary) hypertension: Secondary | ICD-10-CM | POA: Diagnosis not present

## 2019-11-21 DIAGNOSIS — E1169 Type 2 diabetes mellitus with other specified complication: Secondary | ICD-10-CM | POA: Diagnosis not present

## 2019-11-21 DIAGNOSIS — R2689 Other abnormalities of gait and mobility: Secondary | ICD-10-CM | POA: Diagnosis not present

## 2019-11-30 ENCOUNTER — Ambulatory Visit (HOSPITAL_COMMUNITY)
Admission: RE | Admit: 2019-11-30 | Discharge: 2019-11-30 | Disposition: A | Discharge status: Home / Self Care | Payer: Medicare Other | Source: Ambulatory Visit | Attending: Internal Medicine | Admitting: Internal Medicine

## 2019-11-30 ENCOUNTER — Other Ambulatory Visit: Payer: Self-pay

## 2019-11-30 DIAGNOSIS — R634 Abnormal weight loss: Secondary | ICD-10-CM | POA: Insufficient documentation

## 2019-11-30 DIAGNOSIS — Z8546 Personal history of malignant neoplasm of prostate: Secondary | ICD-10-CM | POA: Diagnosis present

## 2019-11-30 DIAGNOSIS — K8020 Calculus of gallbladder without cholecystitis without obstruction: Secondary | ICD-10-CM | POA: Diagnosis not present

## 2019-11-30 LAB — POCT I-STAT CREATININE: Creatinine, Ser: 0.8 mg/dL (ref 0.61–1.24)

## 2019-11-30 MED ORDER — IOHEXOL 300 MG/ML  SOLN
100.0000 mL | Freq: Once | INTRAMUSCULAR | Status: AC | PRN
  Administered 2019-11-30: 13:00:00 100 mL via INTRAVENOUS

## 2019-12-02 ENCOUNTER — Ambulatory Visit
Admission: RE | Admit: 2019-12-02 | Discharge: 2019-12-02 | Disposition: A | Discharge status: Home / Self Care | Payer: Medicare Other | Source: Ambulatory Visit | Attending: Neurology | Admitting: Neurology

## 2019-12-02 ENCOUNTER — Other Ambulatory Visit: Payer: Self-pay

## 2019-12-02 DIAGNOSIS — R269 Unspecified abnormalities of gait and mobility: Secondary | ICD-10-CM

## 2019-12-02 DIAGNOSIS — R32 Unspecified urinary incontinence: Secondary | ICD-10-CM

## 2019-12-02 DIAGNOSIS — G3281 Cerebellar ataxia in diseases classified elsewhere: Secondary | ICD-10-CM | POA: Diagnosis not present

## 2019-12-02 DIAGNOSIS — W19XXXS Unspecified fall, sequela: Secondary | ICD-10-CM

## 2019-12-04 ENCOUNTER — Telehealth: Payer: Self-pay | Admitting: Neurology

## 2019-12-04 NOTE — Telephone Encounter (Signed)
I spoke to the patient and reviewed the MRI results below. He verbalized understanding and will continue taking his aspirin. I offered him several appts for follow up. He accepted an appt on 12/21/19.

## 2019-12-04 NOTE — Telephone Encounter (Signed)
IMPRESSION: This MRI of the brain without contrast shows the following: 1.   Small subacute lacunar infarction in the right cerebellar hemisphere.  There are also other chronic microvascular ischemic changes in the right middle cerebellar peduncle, left pons in the subcortical, periventricular and deep white matter of the hemispheres.  None of these other foci appear to be acute. 2.   Artifact, likely metal, in the midline frontal region near the vertex of the skull.  IMPRESSION: This MRI of the cervical spine without contrast shows the following: 1.   The spinal cord appears normal. 2.    Mild degenerative changes at C3-C4, C4-C5 and C5-C6 that do not lead to nerve root compression or spinal stenosis.  Please call patient, MRI of the brain showed small subacute lacunar infarction in the right cerebellum hemisphere, there is also evidence of previous small vessel disease,  MRI of cervical spine showed no significant abnormalities.  Keep asa 81mg  daily, give him a follow up visit at my next available

## 2019-12-21 ENCOUNTER — Encounter: Payer: Self-pay | Admitting: Neurology

## 2019-12-21 ENCOUNTER — Other Ambulatory Visit: Payer: Self-pay

## 2019-12-21 ENCOUNTER — Ambulatory Visit: Payer: Medicare Other | Admitting: Neurology

## 2019-12-21 VITALS — BP 138/75 | HR 74 | Temp 97.2°F | Ht 71.0 in | Wt 189.0 lb

## 2019-12-21 DIAGNOSIS — R269 Unspecified abnormalities of gait and mobility: Secondary | ICD-10-CM | POA: Diagnosis not present

## 2019-12-21 DIAGNOSIS — I639 Cerebral infarction, unspecified: Secondary | ICD-10-CM | POA: Diagnosis not present

## 2019-12-21 MED ORDER — CARBIDOPA-LEVODOPA 25-100 MG PO TABS: 1.0000 | ORAL_TABLET | Freq: Three times a day (TID) | ORAL | 11 refills | Status: DC

## 2019-12-21 MED ORDER — CLOPIDOGREL BISULFATE 75 MG PO TABS: 75.0000 mg | ORAL_TABLET | Freq: Every day | ORAL | 11 refills | Status: DC

## 2019-12-21 NOTE — Progress Notes (Signed)
PATIENT: [name] DOB: [birth year]  Chief Complaint  Patient presents with  . Gait Issues    He is here with his wife, Hoyle Sauer, to review his cervical and brain MRI results.     HISTORICAL  [name] is a 79 year old male, seen in request by his primary care physician Dr. Allyn Kenner for evaluation of gait abnormality, he is accompanied by his wife at today's clinical visit on November 07, 2019.  I have reviewed and summarized the referring note from the referring physician.  He had a past medical history of hypertension, hyperlipidemia, diabetes, coronary artery disease,  He was previously very active, "very physical", enjoying his yard work in the summertime, around November 2020, he noticed gradual onset gait abnormality, fell few times, acute worsening since he fell at the end of December 2020, he fell in the bathtub on his back, following that fall, he had 2 weeks history of urinary incontinence, now gradually improved, but his gait abnormality remains, he was noted to have small stride, careful, stiff gait,  He denied loss of sense of smell, denies REM sleep disorder or memory loss, denies hand tremor, denies bilateral upper or lower extremity paresthesia, denies significant pain, he denies double vision, swallowing difficulty, ptosis,  Laboratory evaluations in January 2021, normal CBC hemoglobin of 13.9, CMP, creatinine of 0.94, LDL was 68, A1c was 7.0,  UPDATE December 21 2019: He continue complains of unsteady gait, seems to have more trouble on his right side  I personally reviewed MRI of brain in March 2021, small subacute lacunar infarction in the right cerebellum hemisphere, now also chronic microvascular ischemic changes in the right middle cerebellar peduncle, left pons, subcortical, periventricular, deep white matter's,  MRI of cervical spine showed multilevel degenerative changes, but there was no evidence of spinal cord or canal stenosis.  Laboratory  evaluation February 2021, normal M19, RPR, folic acid, C-reactive protein, TSH, CPK, ESR, ANA, acetylcholine receptor antibody REVIEW OF SYSTEMS: Full 14 system review of systems performed and notable only for as above All other review of systems were negative.  ALLERGIES: No Known Allergies  HOME MEDICATIONS: Current Outpatient Medications  Medication Sig Dispense Refill  . amLODipine-benazepril (LOTREL) 5-20 MG capsule Take 1 capsule by mouth daily.    Marland Kitchen aspirin EC 81 MG tablet Take 81 mg by mouth daily.    . cetirizine (ZYRTEC) 10 MG tablet Take 10 mg by mouth daily as needed for allergies.    . metFORMIN (GLUCOPHAGE) 1000 MG tablet Take 1,000 mg by mouth 2 (two) times daily with a meal.  3  . simvastatin (ZOCOR) 10 MG tablet Take 10 mg by mouth daily.    . nitroGLYCERIN (NITROSTAT) 0.4 MG SL tablet Place 1 tablet (0.4 mg total) under the tongue every 5 (five) minutes as needed for chest pain. 25 tablet 3   No current facility-administered medications for this visit.    PAST MEDICAL HISTORY: Past Medical History:  Diagnosis Date  . Abnormal gait   . Fatigue   . Hypertension   . Prostate cancer ([place])   . Tremor   . Vitamin D deficiency     PAST SURGICAL HISTORY: Past Surgical History:  Procedure Laterality Date  . CARDIAC SURGERY     bypass  . COLONOSCOPY N/A 02/09/2017   Procedure: COLONOSCOPY;  Surgeon: Aviva Signs, MD;  [facility];  Service: Gastroenterology;  Laterality: N/A;  . HEMORROIDECTOMY    . KNEE SURGERY Right  FAMILY HISTORY: Family History  Problem Relation Age of Onset  . Dementia Mother   . Diabetes Father   . Lung cancer Father   . Breast cancer Sister   . Multiple myeloma Brother     SOCIAL HISTORY: Social History   Socioeconomic History  . Marital status: Married    Spouse name: Not on file  . Number of children: 1  . Years of education: 79  . Highest education level: High school graduate  Occupational History  .  Occupation: Retired  Tobacco Use  . Smoking status: Former Smoker    Packs/day: 1.00    Types: Cigarettes    Quit date: 02/25/1971    Years since quitting: 48.8  . Smokeless tobacco: Former Systems developer    Types: Snuff  Substance and Sexual Activity  . Alcohol use: No    Alcohol/week: 0.0 standard drinks  . Drug use: No  . Sexual activity: Not on file  Other Topics Concern  . Not on file  Social History Narrative   Lives at home with his wife.   Right-handed.   2 cups caffeine per day.   Social Determinants of Health   Financial Resource Strain:   . Difficulty of Paying Living Expenses:   Food Insecurity:   . Worried About Charity fundraiser in the Last Year:   . Arboriculturist in the Last Year:   Transportation Needs:   . Film/video editor (Medical):   Marland Kitchen Lack of Transportation (Non-Medical):   Physical Activity:   . Days of Exercise per Week:   . Minutes of Exercise per Session:   Stress:   . Feeling of Stress :   Social Connections:   . Frequency of Communication with Friends and Family:   . Frequency of Social Gatherings with Friends and Family:   . Attends Religious Services:   . Active Member of Clubs or Organizations:   . Attends Archivist Meetings:   Marland Kitchen Marital Status:   Intimate Partner Violence:   . Fear of Current or Ex-Partner:   . Emotionally Abused:   Marland Kitchen Physically Abused:   . Sexually Abused:      PHYSICAL EXAM   Vitals:   12/21/19 1101  BP: 138/75  Pulse: 74  Temp: (!) 97.2 F (36.2 C)  Weight: 189 lb (85.7 kg)  Height: _0  (1.803 m)    Not recorded      Body mass index is 26.36 kg/m.  PHYSICAL EXAMNIATION:  Gen: NAD, conversant, well nourised, well groomed                     Cardiovascular: Regular rate rhythm, no peripheral edema, warm, nontender. Eyes: Conjunctivae clear without exudates or hemorrhage Neck: Supple, no carotid bruits. Pulmonary: Clear to auscultation bilaterally   NEUROLOGICAL EXAM:  MENTAL  STATUS: Speech:    Speech is normal; fluent and spontaneous with normal comprehension.  Cognition:     Orientation to time, place and person     Normal recent and remote memory     Normal Attention span and concentration     Normal Language, naming, repeating,spontaneous speech     Fund of knowledge   CRANIAL NERVES: CN II: Visual fields are full to confrontation. Pupils are round equal and briskly reactive to light. CN III, IV, VI: extraocular movement are normal. No ptosis. CN V: Facial sensation is intact to light touch CN VII: Face is symmetric with normal eye closure  CN VIII:  Hard of hearing CN IX, X: Phonation is normal. CN XI: Head turning and shoulder shrug are intact  MOTOR: He has mild fixation of right arm upon rapid rotating movement, mild bilateral upper, lower extremity rigidity, bradykinesia,  REFLEXES: Reflexes are 2+ and symmetric at the biceps, triceps, 3/3 knees, and absent at ankles. Plantar responses are extensor bilaterally  SENSORY: Length dependent decreased to light touch, pinprick and vibratory sensation to ankle level COORDINATION: There is no trunk or limb dysmetria noted.  GAIT/STANCE: He needs push-up to get up from seated position, leaning forward, stiff, wide-based cautious, unsteady gait  DIAGNOSTIC DATA (LABS, IMAGING, TESTING) - I reviewed patient records, labs, notes, testing and imaging myself where available.   ASSESSMENT AND PLAN  RYATT CORSINO is a 79 y.o. male   Stroke Gait abnormalities with mild parkinsonian features  He has vascular risk factor of hypertension, diabetes, hyperlipidemia  Stop aspirin, changed to Plavix 75 mg daily  Completes stroke evaluation with echocardiogram, ultrasound of carotid artery  Will give him a trial of Sinemet, 25/100 mg 3 times a day  Refer to physical therapy   Marcial Pacas, M.D. Ph.D.  [facility] [address] [place],  [address] Ph: [phone] Fax:  [phone]  CC: Celene Squibb, MD

## 2019-12-27 DIAGNOSIS — Z7902 Long term (current) use of antithrombotics/antiplatelets: Secondary | ICD-10-CM | POA: Diagnosis not present

## 2019-12-27 DIAGNOSIS — I10 Essential (primary) hypertension: Secondary | ICD-10-CM | POA: Diagnosis not present

## 2019-12-27 DIAGNOSIS — Z7984 Long term (current) use of oral hypoglycemic drugs: Secondary | ICD-10-CM | POA: Diagnosis not present

## 2019-12-27 DIAGNOSIS — R2689 Other abnormalities of gait and mobility: Secondary | ICD-10-CM | POA: Diagnosis not present

## 2019-12-27 DIAGNOSIS — I2510 Atherosclerotic heart disease of native coronary artery without angina pectoris: Secondary | ICD-10-CM | POA: Diagnosis not present

## 2019-12-27 DIAGNOSIS — E1140 Type 2 diabetes mellitus with diabetic neuropathy, unspecified: Secondary | ICD-10-CM | POA: Diagnosis not present

## 2019-12-27 DIAGNOSIS — I69398 Other sequelae of cerebral infarction: Secondary | ICD-10-CM | POA: Diagnosis not present

## 2020-01-01 ENCOUNTER — Ambulatory Visit (HOSPITAL_COMMUNITY)
Admission: RE | Admit: 2020-01-01 | Discharge: 2020-01-01 | Disposition: A | Discharge status: Home / Self Care | Payer: Medicare Other | Source: Ambulatory Visit | Attending: Neurology | Admitting: Neurology

## 2020-01-01 DIAGNOSIS — I080 Rheumatic disorders of both mitral and aortic valves: Secondary | ICD-10-CM | POA: Insufficient documentation

## 2020-01-01 DIAGNOSIS — E785 Hyperlipidemia, unspecified: Secondary | ICD-10-CM | POA: Insufficient documentation

## 2020-01-01 DIAGNOSIS — Z8546 Personal history of malignant neoplasm of prostate: Secondary | ICD-10-CM | POA: Diagnosis not present

## 2020-01-01 DIAGNOSIS — E119 Type 2 diabetes mellitus without complications: Secondary | ICD-10-CM | POA: Insufficient documentation

## 2020-01-01 DIAGNOSIS — I639 Cerebral infarction, unspecified: Secondary | ICD-10-CM | POA: Diagnosis not present

## 2020-01-01 DIAGNOSIS — R269 Unspecified abnormalities of gait and mobility: Secondary | ICD-10-CM | POA: Diagnosis not present

## 2020-01-01 DIAGNOSIS — I119 Hypertensive heart disease without heart failure: Secondary | ICD-10-CM | POA: Diagnosis not present

## 2020-01-01 DIAGNOSIS — I6389 Other cerebral infarction: Secondary | ICD-10-CM

## 2020-01-01 NOTE — Progress Notes (Signed)
*  PRELIMINARY RESULTS* Echocardiogram 2D Echocardiogram has been performed.  [name] 01/01/2020, 3:13 PM

## 2020-01-02 DIAGNOSIS — R2689 Other abnormalities of gait and mobility: Secondary | ICD-10-CM | POA: Diagnosis not present

## 2020-01-02 DIAGNOSIS — I2510 Atherosclerotic heart disease of native coronary artery without angina pectoris: Secondary | ICD-10-CM | POA: Diagnosis not present

## 2020-01-02 DIAGNOSIS — E1140 Type 2 diabetes mellitus with diabetic neuropathy, unspecified: Secondary | ICD-10-CM | POA: Diagnosis not present

## 2020-01-02 DIAGNOSIS — I10 Essential (primary) hypertension: Secondary | ICD-10-CM | POA: Diagnosis not present

## 2020-01-02 DIAGNOSIS — Z7902 Long term (current) use of antithrombotics/antiplatelets: Secondary | ICD-10-CM | POA: Diagnosis not present

## 2020-01-02 DIAGNOSIS — I69398 Other sequelae of cerebral infarction: Secondary | ICD-10-CM | POA: Diagnosis not present

## 2020-01-02 DIAGNOSIS — Z7984 Long term (current) use of oral hypoglycemic drugs: Secondary | ICD-10-CM | POA: Diagnosis not present

## 2020-01-03 ENCOUNTER — Telehealth: Payer: Self-pay | Admitting: Neurology

## 2020-01-03 DIAGNOSIS — Q211 Atrial septal defect: Secondary | ICD-10-CM

## 2020-01-03 DIAGNOSIS — Q2112 Patent foramen ovale: Secondary | ICD-10-CM

## 2020-01-03 NOTE — Telephone Encounter (Signed)
Pt's wife is asking for a call back from [facility] on tomorrow to schedule.

## 2020-01-03 NOTE — Telephone Encounter (Signed)
   1. Left ventricular ejection fraction, by estimation, is 50 to 55%. The left ventricle has low normal function. The left ventricle has no regional wall motion abnormalities. The left ventricular internal cavity size was mildly dilated. There is mild  left ventricular hypertrophy. Left ventricular diastolic parameters are consistent with Grade I diastolic dysfunction (impaired relaxation).  2. Right ventricular systolic function is normal. The right ventricular size is normal. There is normal pulmonary artery systolic pressure. The estimated right ventricular systolic pressure is 0000000 mmHg.  3. The mitral valve is grossly normal. Trivial mitral valve regurgitation.  4. The aortic valve is tricuspid. Aortic valve regurgitation is not visualized. Mild aortic valve sclerosis is present, with no evidence of aortic valve stenosis.  5. The inferior vena cava is normal in size with greater than 50% respiratory variability, suggesting right atrial pressure of 3 mmHg.  6. The atrial septum is mobile with apparent bidirectional shunting by color Doppler consistent with PFO, subcostals are suboptimal. Suggest bubble study.  Please call patient, ECHO showed PFO for evaluation of potential right to left shunt, I have ordered bubble study     ECHOCARDIOGRAM LIMITED BUBBLE STUDY         VAS [place] TRANSCRANIAL DOPPLER W BUBBLES         Please make these happen at same time and same location if possible

## 2020-01-03 NOTE — Telephone Encounter (Signed)
I spoke to the patient and he is aware of the results below. He would like his wife called to schedule the additional test that was ordered.

## 2020-01-03 NOTE — Telephone Encounter (Signed)
Patient is scheduled for his test . Patient's wife will call me to schedule.

## 2020-01-04 ENCOUNTER — Telehealth: Payer: Self-pay | Admitting: Cardiovascular Disease

## 2020-01-04 DIAGNOSIS — R2689 Other abnormalities of gait and mobility: Secondary | ICD-10-CM | POA: Diagnosis not present

## 2020-01-04 DIAGNOSIS — I10 Essential (primary) hypertension: Secondary | ICD-10-CM | POA: Diagnosis not present

## 2020-01-04 DIAGNOSIS — Z7984 Long term (current) use of oral hypoglycemic drugs: Secondary | ICD-10-CM | POA: Diagnosis not present

## 2020-01-04 DIAGNOSIS — I69398 Other sequelae of cerebral infarction: Secondary | ICD-10-CM | POA: Diagnosis not present

## 2020-01-04 DIAGNOSIS — I2510 Atherosclerotic heart disease of native coronary artery without angina pectoris: Secondary | ICD-10-CM | POA: Diagnosis not present

## 2020-01-04 DIAGNOSIS — Z7902 Long term (current) use of antithrombotics/antiplatelets: Secondary | ICD-10-CM | POA: Diagnosis not present

## 2020-01-04 DIAGNOSIS — E1140 Type 2 diabetes mellitus with diabetic neuropathy, unspecified: Secondary | ICD-10-CM | POA: Diagnosis not present

## 2020-01-04 NOTE — Telephone Encounter (Signed)
Thank you for this update.  I did review the most recent echocardiogram and will await the upcoming one as well.

## 2020-01-04 NOTE — Telephone Encounter (Signed)
Phone disconnected when call attempted

## 2020-01-04 NOTE — Telephone Encounter (Signed)
Patient's wife Hoyle Sauer) called in regards to some recent test done by [facility] She wanted Dr. Bronson Ing to review.

## 2020-01-04 NOTE — Telephone Encounter (Signed)
Wife called back to make Dr. Raliegh Ip aware of recent echo and findings "hole in the heart" per wife. Also says patient will be having another echo and carotid in May 2021 at [facility].

## 2020-01-05 DIAGNOSIS — E1165 Type 2 diabetes mellitus with hyperglycemia: Secondary | ICD-10-CM | POA: Diagnosis not present

## 2020-01-05 DIAGNOSIS — E119 Type 2 diabetes mellitus without complications: Secondary | ICD-10-CM | POA: Diagnosis not present

## 2020-01-05 DIAGNOSIS — E1169 Type 2 diabetes mellitus with other specified complication: Secondary | ICD-10-CM | POA: Diagnosis not present

## 2020-01-09 NOTE — Telephone Encounter (Signed)
Patient's wife is aware of all apt's.

## 2020-01-10 DIAGNOSIS — H3530 Unspecified macular degeneration: Secondary | ICD-10-CM | POA: Diagnosis not present

## 2020-01-10 DIAGNOSIS — I10 Essential (primary) hypertension: Secondary | ICD-10-CM | POA: Diagnosis not present

## 2020-01-10 DIAGNOSIS — I2510 Atherosclerotic heart disease of native coronary artery without angina pectoris: Secondary | ICD-10-CM | POA: Diagnosis not present

## 2020-01-10 DIAGNOSIS — E1169 Type 2 diabetes mellitus with other specified complication: Secondary | ICD-10-CM | POA: Diagnosis not present

## 2020-01-10 DIAGNOSIS — E782 Mixed hyperlipidemia: Secondary | ICD-10-CM | POA: Diagnosis not present

## 2020-01-11 DIAGNOSIS — R2689 Other abnormalities of gait and mobility: Secondary | ICD-10-CM | POA: Diagnosis not present

## 2020-01-11 DIAGNOSIS — I69398 Other sequelae of cerebral infarction: Secondary | ICD-10-CM | POA: Diagnosis not present

## 2020-01-11 DIAGNOSIS — I2510 Atherosclerotic heart disease of native coronary artery without angina pectoris: Secondary | ICD-10-CM | POA: Diagnosis not present

## 2020-01-11 DIAGNOSIS — I10 Essential (primary) hypertension: Secondary | ICD-10-CM | POA: Diagnosis not present

## 2020-01-11 DIAGNOSIS — Z7984 Long term (current) use of oral hypoglycemic drugs: Secondary | ICD-10-CM | POA: Diagnosis not present

## 2020-01-11 DIAGNOSIS — Z7902 Long term (current) use of antithrombotics/antiplatelets: Secondary | ICD-10-CM | POA: Diagnosis not present

## 2020-01-11 DIAGNOSIS — E1140 Type 2 diabetes mellitus with diabetic neuropathy, unspecified: Secondary | ICD-10-CM | POA: Diagnosis not present

## 2020-01-12 DIAGNOSIS — I10 Essential (primary) hypertension: Secondary | ICD-10-CM | POA: Diagnosis not present

## 2020-01-12 DIAGNOSIS — E1140 Type 2 diabetes mellitus with diabetic neuropathy, unspecified: Secondary | ICD-10-CM | POA: Diagnosis not present

## 2020-01-12 DIAGNOSIS — I69398 Other sequelae of cerebral infarction: Secondary | ICD-10-CM | POA: Diagnosis not present

## 2020-01-12 DIAGNOSIS — R2689 Other abnormalities of gait and mobility: Secondary | ICD-10-CM | POA: Diagnosis not present

## 2020-01-12 DIAGNOSIS — Z7984 Long term (current) use of oral hypoglycemic drugs: Secondary | ICD-10-CM | POA: Diagnosis not present

## 2020-01-12 DIAGNOSIS — I2510 Atherosclerotic heart disease of native coronary artery without angina pectoris: Secondary | ICD-10-CM | POA: Diagnosis not present

## 2020-01-12 DIAGNOSIS — Z7902 Long term (current) use of antithrombotics/antiplatelets: Secondary | ICD-10-CM | POA: Diagnosis not present

## 2020-01-16 DIAGNOSIS — I69398 Other sequelae of cerebral infarction: Secondary | ICD-10-CM | POA: Diagnosis not present

## 2020-01-16 DIAGNOSIS — I10 Essential (primary) hypertension: Secondary | ICD-10-CM | POA: Diagnosis not present

## 2020-01-16 DIAGNOSIS — Z7984 Long term (current) use of oral hypoglycemic drugs: Secondary | ICD-10-CM | POA: Diagnosis not present

## 2020-01-16 DIAGNOSIS — E1140 Type 2 diabetes mellitus with diabetic neuropathy, unspecified: Secondary | ICD-10-CM | POA: Diagnosis not present

## 2020-01-16 DIAGNOSIS — R2689 Other abnormalities of gait and mobility: Secondary | ICD-10-CM | POA: Diagnosis not present

## 2020-01-16 DIAGNOSIS — Z7902 Long term (current) use of antithrombotics/antiplatelets: Secondary | ICD-10-CM | POA: Diagnosis not present

## 2020-01-16 DIAGNOSIS — I2510 Atherosclerotic heart disease of native coronary artery without angina pectoris: Secondary | ICD-10-CM | POA: Diagnosis not present

## 2020-01-17 DIAGNOSIS — H35372 Puckering of macula, left eye: Secondary | ICD-10-CM | POA: Diagnosis not present

## 2020-01-17 DIAGNOSIS — D3131 Benign neoplasm of right choroid: Secondary | ICD-10-CM | POA: Diagnosis not present

## 2020-01-17 DIAGNOSIS — H353231 Exudative age-related macular degeneration, bilateral, with active choroidal neovascularization: Secondary | ICD-10-CM | POA: Diagnosis not present

## 2020-01-19 DIAGNOSIS — I10 Essential (primary) hypertension: Secondary | ICD-10-CM | POA: Diagnosis not present

## 2020-01-19 DIAGNOSIS — R2689 Other abnormalities of gait and mobility: Secondary | ICD-10-CM | POA: Diagnosis not present

## 2020-01-19 DIAGNOSIS — Z7902 Long term (current) use of antithrombotics/antiplatelets: Secondary | ICD-10-CM | POA: Diagnosis not present

## 2020-01-19 DIAGNOSIS — I69398 Other sequelae of cerebral infarction: Secondary | ICD-10-CM | POA: Diagnosis not present

## 2020-01-19 DIAGNOSIS — E1140 Type 2 diabetes mellitus with diabetic neuropathy, unspecified: Secondary | ICD-10-CM | POA: Diagnosis not present

## 2020-01-19 DIAGNOSIS — Z7984 Long term (current) use of oral hypoglycemic drugs: Secondary | ICD-10-CM | POA: Diagnosis not present

## 2020-01-19 DIAGNOSIS — I2510 Atherosclerotic heart disease of native coronary artery without angina pectoris: Secondary | ICD-10-CM | POA: Diagnosis not present

## 2020-01-24 ENCOUNTER — Ambulatory Visit (HOSPITAL_BASED_OUTPATIENT_CLINIC_OR_DEPARTMENT_OTHER)
Admission: RE | Admit: 2020-01-24 | Discharge: 2020-01-24 | Disposition: A | Discharge status: Home / Self Care | Payer: Medicare Other | Source: Ambulatory Visit | Attending: Neurology | Admitting: Neurology

## 2020-01-24 ENCOUNTER — Other Ambulatory Visit: Payer: Self-pay

## 2020-01-24 ENCOUNTER — Ambulatory Visit (HOSPITAL_COMMUNITY)
Admission: RE | Admit: 2020-01-24 | Discharge: 2020-01-24 | Disposition: A | Discharge status: Home / Self Care | Payer: Medicare Other | Source: Ambulatory Visit | Attending: Neurology | Admitting: Neurology

## 2020-01-24 ENCOUNTER — Telehealth: Payer: Self-pay | Admitting: Neurology

## 2020-01-24 DIAGNOSIS — I639 Cerebral infarction, unspecified: Secondary | ICD-10-CM

## 2020-01-24 DIAGNOSIS — Q211 Atrial septal defect: Secondary | ICD-10-CM

## 2020-01-24 DIAGNOSIS — Q2112 Patent foramen ovale: Secondary | ICD-10-CM

## 2020-01-24 DIAGNOSIS — R269 Unspecified abnormalities of gait and mobility: Secondary | ICD-10-CM

## 2020-01-24 NOTE — Progress Notes (Signed)
Carotid duplex and TCD bubble study has been completed.   Preliminary results in CV Proc.   Abram Sander 01/24/2020 2:05 PM

## 2020-01-24 NOTE — Telephone Encounter (Signed)
Please call patient, Doppler study showed less than 39% stenosis of bilateral internal carotid artery, anterograde flow of bilateral vertebral artery, no evidence of right to left intra-atrial shunt  He is to continue current medication management

## 2020-01-24 NOTE — Progress Notes (Signed)
  Echocardiogram 2D Echocardiogram has been performed.  Jennette Dubin 01/24/2020, 3:42 PM

## 2020-01-25 NOTE — Telephone Encounter (Signed)
I spoke to the patient. He is aware of his test results and in agreement to continue his current medications.

## 2020-02-06 ENCOUNTER — Ambulatory Visit: Payer: Medicare Other | Admitting: General Surgery

## 2020-02-06 ENCOUNTER — Encounter: Payer: Self-pay | Admitting: General Surgery

## 2020-02-06 ENCOUNTER — Other Ambulatory Visit: Payer: Self-pay

## 2020-02-06 VITALS — BP 123/73 | HR 81 | Temp 98.1°F | Resp 14 | Ht 69.0 in | Wt 188.0 lb

## 2020-02-06 DIAGNOSIS — K8020 Calculus of gallbladder without cholecystitis without obstruction: Secondary | ICD-10-CM

## 2020-02-06 NOTE — Patient Instructions (Signed)

## 2020-02-07 ENCOUNTER — Encounter: Payer: Self-pay | Admitting: Internal Medicine

## 2020-02-07 ENCOUNTER — Ambulatory Visit: Payer: Medicare Other | Admitting: Internal Medicine

## 2020-02-07 ENCOUNTER — Ambulatory Visit (HOSPITAL_COMMUNITY)
Admission: RE | Admit: 2020-02-07 | Discharge: 2020-02-07 | Disposition: A | Discharge status: Home / Self Care | Payer: Medicare Other | Source: Ambulatory Visit | Attending: Internal Medicine | Admitting: Internal Medicine

## 2020-02-07 DIAGNOSIS — Z0389 Encounter for observation for other suspected diseases and conditions ruled out: Secondary | ICD-10-CM | POA: Diagnosis not present

## 2020-02-07 DIAGNOSIS — J300 Vasomotor rhinitis: Secondary | ICD-10-CM | POA: Diagnosis not present

## 2020-02-07 DIAGNOSIS — J8410 Pulmonary fibrosis, unspecified: Secondary | ICD-10-CM | POA: Insufficient documentation

## 2020-02-07 MED ORDER — IPRATROPIUM BROMIDE 0.03 % NA SOLN: 2.0000 | Freq: Two times a day (BID) | NASAL | 12 refills | Status: DC

## 2020-02-07 NOTE — Patient Instructions (Addendum)
Atrovent nasal spray in each nostril twice daily - point it toward your ear on the same side   Make sure you check your oxygen saturations at highest level of activity to be sure it stays over 90%  And let me know if you feel you are losing ground   Please remember to go to the  x-ray department  for your tests - we will call you with the results when they are available    Please schedule a follow up visit in 3 months but call sooner if needed   Add PFTs on return

## 2020-02-07 NOTE — Assessment & Plan Note (Signed)
Quit smoking 1972 - exposure to marble countertop dust x years  -  Chest CT cuts on abd ct 11/30/19  Lower chest: Areas of ground-glass attenuation and septal thickening in the visualized lung bases bilaterally. Atherosclerotic calcifications in the descending thoracic aorta as well as the left circumflex and right coronary arteries - 02/07/2020   Walked on RA   200 ft -@ slow  pace stopped due to end of study, sats 99%   DDx for pulmonary fibrosis  includes idiopathic pulmonary fibrosis, pulmonary fibrosis associated with rheumatologic diseases (which have a relatively benign course in most cases) , adverse effect from  drugs such as chemotherapy or amiodarone exposure, nonspecific interstitial pneumonia which is typically steroid responsive, and chronic hypersensitivity pneumonitis.   In active  smokers Langerhan's Cell  Histiocyctosis (eosinophilic granuomatosis),  DIP,  and Respiratory Bronchiolitis ILD also need to be considered,  But not likely here in this remote smoker  For now since having no symptoms and sats are great walking there is no urgency to w/u but would like to get him to check sats regularly and do a baseline set of pfts 02/07/2020 and consider HRCT and serologies at that point.  Discussed in detail all the  indications, usual  risks and alternatives  relative to the benefits with patient who agrees to proceed with w/u as outlined.            Each maintenance medication was reviewed in detail including emphasizing most importantly the difference between maintenance and prns and under what circumstances the prns are to be triggered using an action plan format where appropriate.  Total time for H and P, chart review, counseling,  directly observing portions of ambulatory 02 saturation study/  and generating customized AVS unique to this office visit / charting = 60 min

## 2020-02-07 NOTE — Progress Notes (Signed)
[name]; [id number]; 05-Nov-1940   HPI Patient is a 79 year old white male who was referred to our care by Dr. Nevada Crane for evaluation treatment of cholelithiasis.  This was recently seen on a CAT scan.  He was being worked up for weight loss of unknown etiology.  Patient denies any epigastric pain, nausea, vomiting, fatty food intolerance, fever, chills, or jaundice.  He currently has 0 out of 10 abdominal pain.  Patient does have history of prostate cancer.  Patient has had a 10 pound weight loss over the past few months.  He denies any early satiety. Past Medical History:  Diagnosis Date  . Abnormal gait   . Fatigue   . Hypertension   . Prostate cancer ([place])   . Tremor   . Vitamin D deficiency     Past Surgical History:  Procedure Laterality Date  . CARDIAC SURGERY     bypass  . COLONOSCOPY N/A 02/09/2017   Procedure: COLONOSCOPY;  Surgeon: Aviva Signs, MD;  [facility];  Service: Gastroenterology;  Laterality: N/A;  . HEMORROIDECTOMY    . KNEE SURGERY Right     Family History  Problem Relation Age of Onset  . Dementia Mother   . Diabetes Father   . Lung cancer Father   . Breast cancer Sister   . Multiple myeloma Brother     Current Outpatient Medications on File Prior to Visit  Medication Sig Dispense Refill  . amLODipine-benazepril (LOTREL) 5-20 MG capsule Take 1 capsule by mouth daily.    . carbidopa-levodopa (SINEMET IR) 25-100 MG tablet Take 1 tablet by mouth 3 (three) times daily. 90 tablet 11  . cetirizine (ZYRTEC) 10 MG tablet Take 10 mg by mouth daily as needed for allergies.    Marland Kitchen clopidogrel (PLAVIX) 75 MG tablet Take 1 tablet (75 mg total) by mouth daily. 30 tablet 11  . metFORMIN (GLUCOPHAGE) 1000 MG tablet Take 1,000 mg by mouth 2 (two) times daily with a meal.  3  . Multiple Vitamins-Minerals (PRESERVISION AREDS 2+MULTI VIT) CAPS Take by mouth.    . simvastatin (ZOCOR) 10 MG tablet Take 10 mg by mouth daily.    . nitroGLYCERIN (NITROSTAT) 0.4  MG SL tablet Place 1 tablet (0.4 mg total) under the tongue every 5 (five) minutes as needed for chest pain. 25 tablet 3   No current facility-administered medications on file prior to visit.    No Known Allergies  Social History   Substance and Sexual Activity  Alcohol Use No  . Alcohol/week: 0.0 standard drinks    Social History   Tobacco Use  Smoking Status Former Smoker  . Packs/day: 1.00  . Types: Cigarettes  . Quit date: 02/25/1971  . Years since quitting: 48.9  Smokeless Tobacco Former Systems developer  . Types: Snuff    Review of Systems  Constitutional: Positive for malaise/fatigue.  HENT: Negative.   Eyes: Positive for blurred vision.  Respiratory: Negative.   Cardiovascular: Negative.   Gastrointestinal: Negative.   Genitourinary: Negative.   Musculoskeletal: Negative.   Skin: Negative.   Neurological: Negative.   Endo/Heme/Allergies: Negative.   Psychiatric/Behavioral: Negative.     Objective   Vitals:   02/06/20 1338  BP: 123/73  Pulse: 81  Resp: 14  Temp: 98.1 F (36.7 C)  SpO2: 96%    Physical Exam Vitals reviewed.  Constitutional:      Appearance: Normal appearance. He is normal weight. He is not ill-appearing.  HENT:     Head: Normocephalic and atraumatic.  Cardiovascular:     Rate and Rhythm: Normal rate and regular rhythm.     Heart sounds: Normal heart sounds. No murmur. No friction rub. No gallop.   Pulmonary:     Effort: Pulmonary effort is normal. No respiratory distress.     Breath sounds: Normal breath sounds. No stridor. No wheezing, rhonchi or rales.  Abdominal:     General: Abdomen is flat. There is no distension.     Palpations: Abdomen is soft. There is no mass.     Tenderness: There is no abdominal tenderness. There is no guarding or rebound.     Hernia: No hernia is present.  Skin:    General: Skin is warm and dry.  Neurological:     Mental Status: He is alert and oriented to person, place, and time.   Primary care notes  reviewed  Assessment  Cholelithiasis, currently asymptomatic Plan   As his cholelithiasis is asymptomatic, no need for cholecystectomy at this time.  The signs and symptoms of biliary colic were explained to the patient and literature was given.  He was instructed to return to my care should these develop.  He understands this and agrees.  He does not want surgery unless absolutely necessary.  Follow-up as needed.

## 2020-02-07 NOTE — Assessment & Plan Note (Signed)
Onset in 1990s - trial of atrovent 0.06NS 02/07/2020 >>>   No response to otcs to date, worse p eats with perennial pattern not typical for allergic mech so rec trial of anticholinergics and then consider further w/u by ent or allergy prn

## 2020-02-07 NOTE — Progress Notes (Signed)
[name], male    DOB: April 07, 1941, 79 y.o.   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 ILD so referred to pulmonary clinic 02/07/2020 by Dr  Delphina Cahill.     History of Present Illness  02/07/2020  Pulmonary/ 1st office eval/Jaya Lapka - already had vacccine Chief Complaint  Patient presents with  . Pulmonary Consult    Referred by Dr. Wende Neighbors for abnormal lung findings on ct abdomen and pelvis 11/30/19. Pt denies any respiratory co's.  Dyspnea:  Slowed by balance not breathing x 18 months / still can do landings but doesn't usually do full steps  Cough: minimal / pnds x years p meals / does use otc ? Zyrtec helps sneezing  Sleep: no problem flat  SABA use: no  Wt loss x 3 months, appetite better now / not related to shots   No obvious day to day or daytime variability or assoc excess/ purulent sputum or mucus plugs or hemoptysis or cp or chest tightness, subjective wheeze or overt sinus or hb symptoms.   Sleeping  without nocturnal  or early am exacerbation  of respiratory  c/o's or need for noct saba. Also denies any obvious fluctuation of symptoms with weather or environmental changes or other aggravating or alleviating factors except as outlined above   No unusual exposure hx or h/o childhood pna/ asthma or knowledge of premature birth.  Current Allergies, Complete Past Medical History, Past Surgical History, Family History, and Social History were reviewed in [employer] record.  ROS  The following are not active complaints unless bolded Hoarseness, sore throat, dysphagia, dental problems, itching, sneezing,  nasal congestion or discharge of excess mucus= watery or purulent secretions, ear ache,   fever, chills, sweats, unintended wt loss has now  stabilized  or wt gain, classically pleuritic or exertional cp,  orthopnea pnd or arm/hand swelling  or leg swelling, presyncope, palpitations, abdominal pain, anorexia, nausea, vomiting, diarrhea  or change in bowel habits or change in bladder habits, change in stools or change in urine, dysuria, hematuria,  rash, arthralgias, visual complaints, headache, numbness, weakness or ataxia or problems with walking or coordination/ walks with cane,  change in mood or  memory.            Past Medical History:  Diagnosis Date  . Abnormal gait   . Fatigue   . Hypertension   . Prostate cancer ([place])   . Tremor   . Vitamin D deficiency     Outpatient Medications Prior to Visit  Medication Sig Dispense Refill  . amLODipine-benazepril (LOTREL) 5-20 MG capsule Take 1 capsule by mouth daily.    . carbidopa-levodopa (SINEMET IR) 25-100 MG tablet Take 1 tablet by mouth 3 (three) times daily. 90 tablet 11  . cetirizine (ZYRTEC) 10 MG tablet Take 10 mg by mouth daily as needed for allergies.    Marland Kitchen clopidogrel (PLAVIX) 75 MG tablet Take 1 tablet (75 mg total) by mouth daily. 30 tablet 11  . metFORMIN (GLUCOPHAGE) 1000 MG tablet Take 1,000 mg by mouth 2 (two) times daily with a meal.  3  . Multiple Vitamins-Minerals (PRESERVISION AREDS 2+MULTI VIT) CAPS Take by mouth.    . nitroGLYCERIN (NITROSTAT) 0.4 MG SL tablet Place 1 tablet (  0.4 mg total) under the tongue every 5 (five) minutes as needed for chest pain. 25 tablet 3  . simvastatin (ZOCOR) 10 MG tablet Take 10 mg by mouth daily.           Objective:     BP 124/74 (BP Location: Left Arm, Cuff Size: Normal)   Pulse 83   Temp (!) 97.2 F (36.2 C) (Temporal)   Ht 5\' 9"  (1.753 m)   Wt 187 lb (84.8 kg)   SpO2 98% Comment: on RA  BMI 27.62 kg/m   SpO2: 98 %(on RA)   slt hoarse amb wm / slow moving with cane   HEENT : pt wearing mask not removed for exam due to covid -19 concerns.    NECK :  without JVD/Nodes/TM/ nl carotid upstrokes  bilaterally   LUNGS: no acc muscle use,  Nl contour chest which is clear to A and P bilaterally without cough on insp or exp maneuvers   CV:  RRR  no s3 or murmur or increase in P2, and no edema   ABD:  soft and nontender with nl inspiratory excursion in the supine position. No bruits or organomegaly appreciated, bowel sounds nl  MS:   ext warm without deformities, calf tenderness, cyanosis or clubbing No obvious joint restrictions   SKIN: warm and dry without lesions    NEURO:  alert, approp, nl sensorium with  no motor or cerebellar deficits apparent/ no significant tremor or rigidity, slt wide based gait noted    CXR PA and Lateral:   02/07/2020 :    I personally reviewed images and   impression as follows:   Very mild ILD     I personally reviewed images and agree with radiology impression as follows:   Chest CT cuts on abd ct 11/30/19  Lower chest: Areas of ground-glass attenuation and septal thickening in the visualized lung bases bilaterally. Atherosclerotic calcifications in the descending thoracic aorta as well as the left circumflex and right coronary arteries.     Assessment   Postinflammatory pulmonary fibrosis (HCC) Quit smoking 1972 - exposure to marble countertop dust x years  -  Chest CT cuts on abd ct 11/30/19  Lower chest: Areas of ground-glass attenuation and septal thickening in the visualized lung bases bilaterally. Atherosclerotic calcifications in the descending thoracic aorta as well as the left circumflex and right coronary arteries - 02/07/2020   Walked on RA   200 ft -@ slow  pace stopped due to end of study, sats 99%   DDx for pulmonary fibrosis  includes idiopathic pulmonary fibrosis, pulmonary fibrosis associated with rheumatologic diseases (which have a relatively benign course in most cases) , adverse effect from  drugs such as chemotherapy or amiodarone exposure, nonspecific interstitial pneumonia which is typically steroid responsive, and  chronic hypersensitivity pneumonitis.   In active  smokers Langerhan's Cell  Histiocyctosis (eosinophilic granuomatosis),  DIP,  and Respiratory Bronchiolitis ILD also need to be considered,  But not likely here in this remote smoker  For now since having no symptoms and sats are great walking there is no urgency to w/u but would like to get him to check sats regularly and do a baseline set of pfts 02/07/2020 and consider HRCT and serologies at that point.  Discussed in detail all the  indications, usual  risks and alternatives  relative to the benefits with patient who agrees to proceed with w/u as outlined.        Vasomotor rhinitis Onset in 1990s -  trial of atrovent 0.06NS 02/07/2020 >>>   No response to otcs to date, worse p eats with perennial pattern not typical for allergic mech so rec trial of anticholinergics and then consider further w/u by ent or allergy prn        Each maintenance medication was reviewed in detail including emphasizing most importantly the difference between maintenance and prns and under what circumstances the prns are to be triggered using an action plan format where appropriate.  Total time for H and P, chart review, counseling,  directly observing portions of ambulatory 02 saturation study/  and generating customized AVS unique to this office visit / charting = 60 min          Christinia Gully, MD 02/07/2020

## 2020-02-08 NOTE — Addendum Note (Signed)
Addended by: Vanessa Barbara on: 02/08/2020 03:23 PM   Modules accepted: Orders

## 2020-02-08 NOTE — Progress Notes (Signed)
Results called to patient.  He verbalized understanding.

## 2020-02-29 DIAGNOSIS — H6123 Impacted cerumen, bilateral: Secondary | ICD-10-CM | POA: Diagnosis not present

## 2020-03-06 DIAGNOSIS — H905 Unspecified sensorineural hearing loss: Secondary | ICD-10-CM | POA: Diagnosis not present

## 2020-03-27 DIAGNOSIS — H43811 Vitreous degeneration, right eye: Secondary | ICD-10-CM | POA: Diagnosis not present

## 2020-03-27 DIAGNOSIS — D3131 Benign neoplasm of right choroid: Secondary | ICD-10-CM | POA: Diagnosis not present

## 2020-03-27 DIAGNOSIS — H35372 Puckering of macula, left eye: Secondary | ICD-10-CM | POA: Diagnosis not present

## 2020-03-27 DIAGNOSIS — H353231 Exudative age-related macular degeneration, bilateral, with active choroidal neovascularization: Secondary | ICD-10-CM | POA: Diagnosis not present

## 2020-04-23 ENCOUNTER — Ambulatory Visit: Payer: Medicare Other | Admitting: Neurology

## 2020-04-23 ENCOUNTER — Encounter: Payer: Self-pay | Admitting: Neurology

## 2020-04-23 VITALS — BP 146/71 | HR 83 | Ht 69.0 in | Wt 190.5 lb

## 2020-04-23 DIAGNOSIS — G20 Parkinson's disease: Secondary | ICD-10-CM | POA: Diagnosis not present

## 2020-04-23 DIAGNOSIS — I639 Cerebral infarction, unspecified: Secondary | ICD-10-CM | POA: Diagnosis not present

## 2020-04-23 MED ORDER — CLOPIDOGREL BISULFATE 75 MG PO TABS: 75.0000 mg | ORAL_TABLET | Freq: Every day | ORAL | 4 refills | Status: DC

## 2020-04-23 MED ORDER — CARBIDOPA-LEVODOPA 25-100 MG PO TABS: 2.0000 | ORAL_TABLET | Freq: Three times a day (TID) | ORAL | 4 refills | Status: DC

## 2020-04-23 NOTE — Progress Notes (Signed)
PATIENT: [name] DOB: [birth year]  Chief Complaint  Patient presents with  . Hx of CVA/Gait Issues    He is here with his wife, Hoyle Sauer. He has continued to take Plavix for stroke prevention. Feels his course of PT was helpful. Reports Sinemet has been beneficial for his stiffness and balance. He does feel he is still walking slowly.  A cane is being used for support.      HISTORICAL  REAL CONA is a 79 year old male, seen in request by his primary care physician Dr. Allyn Kenner for evaluation of gait abnormality, he is accompanied by his wife at today's clinical visit on November 07, 2019.  I have reviewed and summarized the referring note from the referring physician.  He had a past medical history of hypertension, hyperlipidemia, diabetes, coronary artery disease,  He was previously very active, "very physical", enjoying his yard work in the summertime, around November 2020, he noticed gradual onset gait abnormality, fell few times, acute worsening since he fell at the end of December 2020, he fell in the bathtub on his back, following that fall, he had 2 weeks history of urinary incontinence, now gradually improved, but his gait abnormality remains, he was noted to have small stride, careful, stiff gait,  He denied loss of sense of smell, denies REM sleep disorder or memory loss, denies hand tremor, denies bilateral upper or lower extremity paresthesia, denies significant pain, he denies double vision, swallowing difficulty, ptosis,  Laboratory evaluations in January 2021, normal CBC hemoglobin of 13.9, CMP, creatinine of 0.94, LDL was 68, A1c was 7.0,  UPDATE December 21 2019: He continue complains of unsteady gait, seems to have more trouble on his right side  MRI of brain in March 2021, small subacute lacunar infarction in the right cerebellum hemisphere,also chronic microvascular ischemic changes in the right middle cerebellar peduncle, left pons, subcortical,  periventricular, deep white matter's,  MRI of cervical spine showed multilevel degenerative changes, but there was no evidence of spinal cord or canal stenosis.  Laboratory evaluation February 2021, normal B16, RPR, folic acid, C-reactive protein, TSH, CPK, ESR, ANA, acetylcholine receptor antibody  UPDATE April 23 2020: Snienemt has helped his walking, reported mild to moderate improvement, taking it at 9 AM, 1 PM, 6 PM, noticed elevated 30 minutes after medication, also noticed wearing off before the next dose, he is not ambulated with a cane, physical therapy was helpful as well  He denied loss sense of smell, no REM sleep disorder, no significant side effect from Sinemet dose to try higher dose  Echocardiogram showed ejection fraction 50 to 55%, no right-to-left shunt by bubble study Ultrasound of carotid artery showed less than 39% stenosis bilaterally, is taking Plavix 75 mg only  REVIEW OF SYSTEMS: Full 14 system review of systems performed and notable only for as above All other review of systems were negative.  ALLERGIES: No Known Allergies  HOME MEDICATIONS: Current Outpatient Medications  Medication Sig Dispense Refill  . amLODipine-benazepril (LOTREL) 5-20 MG capsule Take 1 capsule by mouth daily.    . carbidopa-levodopa (SINEMET IR) 25-100 MG tablet Take 1 tablet by mouth 3 (three) times daily. 90 tablet 11  . cetirizine (ZYRTEC) 10 MG tablet Take 10 mg by mouth daily as needed for allergies.    Marland Kitchen clopidogrel (PLAVIX) 75 MG tablet Take 1 tablet (75 mg total) by mouth daily. 30 tablet 11  . ipratropium (ATROVENT) 0.03 % nasal spray Place 2 sprays into both nostrils every 12 (  twelve) hours. 30 mL 12  . metFORMIN (GLUCOPHAGE) 1000 MG tablet Take 1,000 mg by mouth 2 (two) times daily with a meal.  3  . Multiple Vitamins-Minerals (PRESERVISION AREDS 2+MULTI VIT) CAPS Take by mouth.    . simvastatin (ZOCOR) 10 MG tablet Take 10 mg by mouth daily.    . nitroGLYCERIN (NITROSTAT)  0.4 MG SL tablet Place 1 tablet (0.4 mg total) under the tongue every 5 (five) minutes as needed for chest pain. 25 tablet 3   No current facility-administered medications for this visit.    PAST MEDICAL HISTORY: Past Medical History:  Diagnosis Date  . Abnormal gait   . Fatigue   . Hypertension   . Prostate cancer ([place])   . Tremor   . Vitamin D deficiency     PAST SURGICAL HISTORY: Past Surgical History:  Procedure Laterality Date  . CARDIAC SURGERY     bypass  . COLONOSCOPY N/A 02/09/2017   Procedure: COLONOSCOPY;  Surgeon: Aviva Signs, MD;  Location: [facility];  Service: Gastroenterology;  Laterality: N/A;  . HEMORROIDECTOMY    . KNEE SURGERY Right     FAMILY HISTORY: Family History  Problem Relation Age of Onset  . Dementia Mother   . Diabetes Father   . Lung cancer Father   . Breast cancer Sister   . Multiple myeloma Brother     SOCIAL HISTORY: Social History   Socioeconomic History  . Marital status: Married    Spouse name: Not on file  . Number of children: 1  . Years of education: 49  . Highest education level: High school graduate  Occupational History  . Occupation: Retired  Tobacco Use  . Smoking status: Former Smoker    Packs/day: 1.00    Years: 9.00    Pack years: 9.00    Types: Cigarettes    Quit date: 02/25/1971    Years since quitting: 49.1  . Smokeless tobacco: Former Systems developer    Types: Snuff  Vaping Use  . Vaping Use: Never used  Substance and Sexual Activity  . Alcohol use: No    Alcohol/week: 0.0 standard drinks  . Drug use: No  . Sexual activity: Not on file  Other Topics Concern  . Not on file  Social History Narrative   Lives at home with his wife.   Right-handed.   2 cups caffeine per day.   Social Determinants of Health   Financial Resource Strain:   . Difficulty of Paying Living Expenses:   Food Insecurity:   . Worried About Charity fundraiser in the Last Year:   . Arboriculturist in the Last Year:    Transportation Needs:   . Film/video editor (Medical):   Marland Kitchen Lack of Transportation (Non-Medical):   Physical Activity:   . Days of Exercise per Week:   . Minutes of Exercise per Session:   Stress:   . Feeling of Stress :   Social Connections:   . Frequency of Communication with Friends and Family:   . Frequency of Social Gatherings with Friends and Family:   . Attends Religious Services:   . Active Member of Clubs or Organizations:   . Attends Archivist Meetings:   Marland Kitchen Marital Status:   Intimate Partner Violence:   . Fear of Current or Ex-Partner:   . Emotionally Abused:   Marland Kitchen Physically Abused:   . Sexually Abused:      PHYSICAL EXAM   Vitals:   04/23/20 1111  BP: (!) 146/71  Pulse: 83  Weight: 190 lb 8 oz (86.4 kg)  Height: _0  (1.753 m)   Not recorded     Body mass index is 28.13 kg/m.  PHYSICAL EXAMNIATION:  Gen: NAD, conversant, well nourised, well groomed                     Cardiovascular: Regular rate rhythm, no peripheral edema, warm, nontender. Eyes: Conjunctivae clear without exudates or hemorrhage Neck: Supple, no carotid bruits. Pulmonary: Clear to auscultation bilaterally   NEUROLOGICAL EXAM:  MENTAL STATUS: Speech/Cognition: Awake, alert, oriented to history taking and casual conversation, mild hard of hearling    CRANIAL NERVES: CN II: Visual fields are full to confrontation. Pupils are round equal and briskly reactive to light. CN III, IV, VI: extraocular movement are normal. No ptosis. CN V: Facial sensation is intact to light touch CN VII: Face is symmetric with normal eye closure  CN VIII: Hard of hearing CN IX, X: Phonation is normal. CN XI: Head turning and shoulder shrug are intact  MOTOR: He has mild fixation of right arm upon rapid rotating movement, mild bilateral upper, lower extremity rigidity, bradykinesia, right worse than left.  REFLEXES: Reflexes are 1 and symmetric at the biceps, triceps, 2/2 knees, and  absent at ankles. Plantar responses are extensor bilaterally  SENSORY: Length dependent decreased to light touch, pinprick and vibratory sensation to ankle level COORDINATION: There is no trunk or limb dysmetria noted.  GAIT/STANCE: He can get up from seated position arm crossed, leaning forward, wide-based cautious, steady ( exam was take 3 hours post am dose sinemet 25/200).  DIAGNOSTIC DATA (LABS, IMAGING, TESTING) - I reviewed patient records, labs, notes, testing and imaging myself where available.   ASSESSMENT AND PLAN  TEJAY HUBERT is a 79 y.o. male   Stroke Parkinson's disease.  He responded and tolerated Sinemet, 25/100 mg 3 times a day, may titrating to 2 tablets tid  Continue plavix.   Marcial Pacas, M.D. Ph.D.  [facility] [address], [place], [place] [address] Ph: [phone] Fax: [phone]  CC: Celene Squibb, MD

## 2020-05-03 DIAGNOSIS — H353231 Exudative age-related macular degeneration, bilateral, with active choroidal neovascularization: Secondary | ICD-10-CM | POA: Diagnosis not present

## 2020-05-16 DIAGNOSIS — E1165 Type 2 diabetes mellitus with hyperglycemia: Secondary | ICD-10-CM | POA: Diagnosis not present

## 2020-05-16 DIAGNOSIS — I25810 Atherosclerosis of coronary artery bypass graft(s) without angina pectoris: Secondary | ICD-10-CM | POA: Diagnosis not present

## 2020-05-16 DIAGNOSIS — I2510 Atherosclerotic heart disease of native coronary artery without angina pectoris: Secondary | ICD-10-CM | POA: Diagnosis not present

## 2020-05-21 DIAGNOSIS — Z0000 Encounter for general adult medical examination without abnormal findings: Secondary | ICD-10-CM | POA: Diagnosis not present

## 2020-05-21 DIAGNOSIS — H6123 Impacted cerumen, bilateral: Secondary | ICD-10-CM | POA: Diagnosis not present

## 2020-05-21 DIAGNOSIS — E782 Mixed hyperlipidemia: Secondary | ICD-10-CM | POA: Diagnosis not present

## 2020-05-21 DIAGNOSIS — Z0001 Encounter for general adult medical examination with abnormal findings: Secondary | ICD-10-CM | POA: Diagnosis not present

## 2020-05-24 ENCOUNTER — Other Ambulatory Visit: Payer: Self-pay

## 2020-05-24 ENCOUNTER — Other Ambulatory Visit (HOSPITAL_COMMUNITY)
Admission: RE | Admit: 2020-05-24 | Discharge: 2020-05-24 | Disposition: A | Discharge status: Home / Self Care | Payer: Medicare Other | Source: Ambulatory Visit | Attending: Internal Medicine | Admitting: Internal Medicine

## 2020-05-24 DIAGNOSIS — Z01812 Encounter for preprocedural laboratory examination: Secondary | ICD-10-CM | POA: Diagnosis not present

## 2020-05-24 DIAGNOSIS — Z20822 Contact with and (suspected) exposure to covid-19: Secondary | ICD-10-CM | POA: Insufficient documentation

## 2020-05-25 LAB — SARS CORONAVIRUS 2 (TAT 6-24 HRS): SARS Coronavirus 2: NEGATIVE

## 2020-05-28 ENCOUNTER — Ambulatory Visit (HOSPITAL_COMMUNITY)
Admission: RE | Admit: 2020-05-28 | Discharge: 2020-05-28 | Disposition: A | Discharge status: Home / Self Care | Payer: Medicare Other | Source: Ambulatory Visit | Attending: Internal Medicine | Admitting: Internal Medicine

## 2020-05-28 ENCOUNTER — Other Ambulatory Visit: Payer: Self-pay

## 2020-05-28 DIAGNOSIS — J8410 Pulmonary fibrosis, unspecified: Secondary | ICD-10-CM | POA: Diagnosis not present

## 2020-05-28 LAB — PULMONARY FUNCTION TEST
DL/VA % pred: 109 %
DL/VA: 4.29 ml/min/mmHg/L
DLCO unc % pred: 99 %
DLCO unc: 23.61 ml/min/mmHg
FEF 25-75 Post: 2.64 L/s
FEF 25-75 Pre: 2.85 L/s
FEF2575-%Change-Post: -7 %
FEF2575-%Pred-Post: 136 %
FEF2575-%Pred-Pre: 147 %
FEV1-%Change-Post: -2 %
FEV1-%Pred-Post: 91 %
FEV1-%Pred-Pre: 93 %
FEV1-Post: 2.55 L
FEV1-Pre: 2.61 L
FEV1FVC-%Change-Post: 1 %
FEV1FVC-%Pred-Pre: 114 %
FEV6-%Change-Post: -4 %
FEV6-%Pred-Post: 83 %
FEV6-%Pred-Pre: 87 %
FEV6-Post: 3.05 L
FEV6-Pre: 3.18 L
FEV6FVC-%Pred-Post: 107 %
FEV6FVC-%Pred-Pre: 107 %
FVC-%Change-Post: -4 %
FVC-%Pred-Post: 78 %
FVC-%Pred-Pre: 81 %
FVC-Post: 3.05 L
FVC-Pre: 3.18 L
Post FEV1/FVC ratio: 84 %
Post FEV6/FVC ratio: 100 %
Pre FEV1/FVC ratio: 82 %
Pre FEV6/FVC Ratio: 100 %
RV % pred: 114 %
RV: 2.96 L
TLC % pred: 87 %
TLC: 5.99 L

## 2020-05-28 MED ORDER — ALBUTEROL SULFATE (2.5 MG/3ML) 0.083% IN NEBU
2.5000 mg | INHALATION_SOLUTION | Freq: Once | RESPIRATORY_TRACT | Status: AC
  Administered 2020-05-28: 2.5 mg via RESPIRATORY_TRACT

## 2020-06-05 DIAGNOSIS — H353231 Exudative age-related macular degeneration, bilateral, with active choroidal neovascularization: Secondary | ICD-10-CM | POA: Diagnosis not present

## 2020-06-05 DIAGNOSIS — H35372 Puckering of macula, left eye: Secondary | ICD-10-CM | POA: Diagnosis not present

## 2020-06-05 DIAGNOSIS — H43811 Vitreous degeneration, right eye: Secondary | ICD-10-CM | POA: Diagnosis not present

## 2020-06-05 DIAGNOSIS — D3131 Benign neoplasm of right choroid: Secondary | ICD-10-CM | POA: Diagnosis not present

## 2020-06-10 NOTE — Progress Notes (Signed)
Tried calling the pt. Someone picked up the phone but did not speak. WCB.

## 2020-06-13 NOTE — Progress Notes (Signed)
Called and spoke with patient, provided results per Dr. Melvyn Novas.  He verbalized understanding.  Nothing further needed.

## 2020-07-17 DIAGNOSIS — H353211 Exudative age-related macular degeneration, right eye, with active choroidal neovascularization: Secondary | ICD-10-CM | POA: Diagnosis not present

## 2020-08-28 DIAGNOSIS — H353231 Exudative age-related macular degeneration, bilateral, with active choroidal neovascularization: Secondary | ICD-10-CM | POA: Diagnosis not present

## 2020-08-28 DIAGNOSIS — H43811 Vitreous degeneration, right eye: Secondary | ICD-10-CM | POA: Diagnosis not present

## 2020-08-28 DIAGNOSIS — H35372 Puckering of macula, left eye: Secondary | ICD-10-CM | POA: Diagnosis not present

## 2020-08-28 DIAGNOSIS — D3131 Benign neoplasm of right choroid: Secondary | ICD-10-CM | POA: Diagnosis not present

## 2020-10-01 ENCOUNTER — Other Ambulatory Visit: Payer: Self-pay | Admitting: Neurology

## 2020-10-11 DIAGNOSIS — N139 Obstructive and reflux uropathy, unspecified: Secondary | ICD-10-CM | POA: Diagnosis not present

## 2020-10-11 DIAGNOSIS — K8020 Calculus of gallbladder without cholecystitis without obstruction: Secondary | ICD-10-CM | POA: Diagnosis not present

## 2020-10-11 DIAGNOSIS — E119 Type 2 diabetes mellitus without complications: Secondary | ICD-10-CM | POA: Diagnosis not present

## 2020-10-11 DIAGNOSIS — R1031 Right lower quadrant pain: Secondary | ICD-10-CM | POA: Diagnosis not present

## 2020-10-11 DIAGNOSIS — I700 Atherosclerosis of aorta: Secondary | ICD-10-CM | POA: Diagnosis not present

## 2020-10-11 DIAGNOSIS — I10 Essential (primary) hypertension: Secondary | ICD-10-CM | POA: Diagnosis not present

## 2020-10-11 DIAGNOSIS — N200 Calculus of kidney: Secondary | ICD-10-CM | POA: Diagnosis not present

## 2020-10-23 DIAGNOSIS — H353222 Exudative age-related macular degeneration, left eye, with inactive choroidal neovascularization: Secondary | ICD-10-CM | POA: Diagnosis not present

## 2020-10-23 DIAGNOSIS — H35372 Puckering of macula, left eye: Secondary | ICD-10-CM | POA: Diagnosis not present

## 2020-10-23 DIAGNOSIS — H353211 Exudative age-related macular degeneration, right eye, with active choroidal neovascularization: Secondary | ICD-10-CM | POA: Diagnosis not present

## 2020-10-23 DIAGNOSIS — D3131 Benign neoplasm of right choroid: Secondary | ICD-10-CM | POA: Diagnosis not present

## 2020-10-24 ENCOUNTER — Encounter: Payer: Self-pay | Admitting: Neurology

## 2020-10-24 ENCOUNTER — Other Ambulatory Visit: Payer: Self-pay

## 2020-10-24 ENCOUNTER — Ambulatory Visit: Payer: Medicare Other | Admitting: Neurology

## 2020-10-24 VITALS — BP 151/73 | HR 84 | Ht 69.0 in | Wt 190.0 lb

## 2020-10-24 DIAGNOSIS — G20 Parkinson's disease: Secondary | ICD-10-CM | POA: Diagnosis not present

## 2020-10-24 DIAGNOSIS — I639 Cerebral infarction, unspecified: Secondary | ICD-10-CM | POA: Diagnosis not present

## 2020-10-24 NOTE — Patient Instructions (Signed)
Continue to take Sinemet at current dosing Try to exercise, walk  See you back in 6 months

## 2020-10-24 NOTE — Progress Notes (Signed)
PATIENT: [name] DOB: 12/05/1940  Chief Complaint  Patient presents with  . Follow-up    New rm, with wife, states he has some balance issues, using cane      HISTORICAL  [name] is a 80 year old male, seen in request by his primary care physician Dr. Allyn Kenner for evaluation of gait abnormality, he is accompanied by his wife at today's clinical visit on November 07, 2019.  I have reviewed and summarized the referring note from the referring physician.  He had a past medical history of hypertension, hyperlipidemia, diabetes, coronary artery disease,  He was previously very active, "very physical", enjoying his yard work in the summertime, around November 2020, he noticed gradual onset gait abnormality, fell few times, acute worsening since he fell at the end of December 2020, he fell in the bathtub on his back, following that fall, he had 2 weeks history of urinary incontinence, now gradually improved, but his gait abnormality remains, he was noted to have small stride, careful, stiff gait,  He denied loss of sense of smell, denies REM sleep disorder or memory loss, denies hand tremor, denies bilateral upper or lower extremity paresthesia, denies significant pain, he denies double vision, swallowing difficulty, ptosis,  Laboratory evaluations in January 2021, normal CBC hemoglobin of 13.9, CMP, creatinine of 0.94, LDL was 68, A1c was 7.0,  UPDATE December 21 2019: He continue complains of unsteady gait, seems to have more trouble on his right side  MRI of brain in March 2021, small subacute lacunar infarction in the right cerebellum hemisphere,also chronic microvascular ischemic changes in the right middle cerebellar peduncle, left pons, subcortical, periventricular, deep white matter's,  MRI of cervical spine showed multilevel degenerative changes, but there was no evidence of spinal cord or canal stenosis.  Laboratory evaluation February 2021, normal K81, RPR, folic  acid, C-reactive protein, TSH, CPK, ESR, ANA, acetylcholine receptor antibody  UPDATE April 23 2020: Snienemt has helped his walking, reported mild to moderate improvement, taking it at 9 AM, 1 PM, 6 PM, noticed elevated 30 minutes after medication, also noticed wearing off before the next dose, he is not ambulated with a cane, physical therapy was helpful as well  He denied loss sense of smell, no REM sleep disorder, no significant side effect from Sinemet dose to try higher dose  Echocardiogram showed ejection fraction 50 to 55%, no right-to-left shunt by bubble study Ultrasound of carotid artery showed less than 39% stenosis bilaterally, is taking Plavix 75 mg only  Update October 24, 2020 SS: Here today with his wife, Hoyle Sauer.  Continues to do well, at last visit, Sinemet 25/100 mg 3 times daily was increased to 2 tablets 3 times daily.  Currently taking 9 AM, 1 PM, 6 PM. Wife notices he is less stiff, moves better and smoother. Does note wearing off effect slightly before next dose, feels slower, that's how he knows is working.  Remains on Plavix.  Had a kidney stone 2 weeks ago, a lot of n/v, afterwards frequent hiccups after eating or drinking.  No more hiccups in the last week. Sleeps well, not very active, they mostly stay home.  His macular degeneration does not allow him to drive. Uses cane.  REVIEW OF SYSTEMS: Full 14 system review of systems performed and notable only for as above All other review of systems were negative.  Walking difficulty  ALLERGIES: No Known Allergies  HOME MEDICATIONS: Current Outpatient Medications  Medication Sig Dispense Refill  . amLODipine-benazepril (LOTREL)  5-20 MG capsule Take 1 capsule by mouth daily.    . carbidopa-levodopa (SINEMET IR) 25-100 MG tablet TAKE TWO TABLETS BY MOUTH THREE TIMES DAILY 180 tablet 4  . cetirizine (ZYRTEC) 10 MG tablet Take 10 mg by mouth daily as needed for allergies.    Marland Kitchen clopidogrel (PLAVIX) 75 MG tablet Take 1  tablet (75 mg total) by mouth daily. 90 tablet 4  . ipratropium (ATROVENT) 0.03 % nasal spray Place 2 sprays into both nostrils every 12 (twelve) hours. 30 mL 12  . metFORMIN (GLUCOPHAGE) 1000 MG tablet Take 1,000 mg by mouth 2 (two) times daily with a meal.  3  . Multiple Vitamins-Minerals (PRESERVISION AREDS 2+MULTI VIT) CAPS Take by mouth.    . nitroGLYCERIN (NITROSTAT) 0.4 MG SL tablet Place 1 tablet (0.4 mg total) under the tongue every 5 (five) minutes as needed for chest pain. 25 tablet 3  . simvastatin (ZOCOR) 10 MG tablet Take 10 mg by mouth daily.     No current facility-administered medications for this visit.    PAST MEDICAL HISTORY: Past Medical History:  Diagnosis Date  . Abnormal gait   . Fatigue   . Hypertension   . Prostate cancer ([place])   . Tremor   . Vitamin D deficiency     PAST SURGICAL HISTORY: Past Surgical History:  Procedure Laterality Date  . CARDIAC SURGERY     bypass  . COLONOSCOPY N/A 02/09/2017   Procedure: COLONOSCOPY;  Surgeon: Aviva Signs, MD;  [facility];  Service: Gastroenterology;  Laterality: N/A;  . HEMORROIDECTOMY    . KNEE SURGERY Right     FAMILY HISTORY: Family History  Problem Relation Age of Onset  . Dementia Mother   . Diabetes Father   . Lung cancer Father   . Breast cancer Sister   . Multiple myeloma Brother     SOCIAL HISTORY: Social History   Socioeconomic History  . Marital status: Married    Spouse name: Not on file  . Number of children: 1  . Years of education: 38  . Highest education level: High school graduate  Occupational History  . Occupation: Retired  Tobacco Use  . Smoking status: Former Smoker    Packs/day: 1.00    Years: 9.00    Pack years: 9.00    Types: Cigarettes    Quit date: 02/25/1971    Years since quitting: 49.6  . Smokeless tobacco: Former Systems developer    Types: Snuff  Vaping Use  . Vaping Use: Never used  Substance and Sexual Activity  . Alcohol use: No    Alcohol/week: 0.0  standard drinks  . Drug use: No  . Sexual activity: Not on file  Other Topics Concern  . Not on file  Social History Narrative   Lives at home with his wife.   Right-handed.   2 cups caffeine per day.   Social Determinants of Health   Financial Resource Strain: Not on file  Food Insecurity: Not on file  Transportation Needs: Not on file  Physical Activity: Not on file  Stress: Not on file  Social Connections: Not on file  Intimate Partner Violence: Not on file   PHYSICAL EXAM   Vitals:   10/24/20 1103  BP: (!) 151/73  Pulse: 84  Weight: 190 lb (86.2 kg)  Height: 5' 9"  (1.753 m)   Not recorded     Body mass index is 28.06 kg/m.  PHYSICAL EXAMNIATION:  Gen: NAD, conversant, well nourised, well groomed  Cardiovascular: Regular rate rhythm, no peripheral edema, warm, nontender. Pulmonary: Clear to auscultation bilaterally   NEUROLOGICAL EXAM:  MENTAL STATUS: Speech/Cognition: Awake, alert, oriented to history taking and casual conversation, mild hard of hearling    CRANIAL NERVES: CN II: Visual fields are full to confrontation. Pupils are round equal and briskly reactive to light. CN III, IV, VI: extraocular movement are normal. No ptosis. CN V: Facial sensation is intact to light touch CN VII: Face is symmetric with normal eye closure  CN VIII: Hard of hearing CN IX, X: Phonation is normal. CN XI: Head turning and shoulder shrug are intact  MOTOR: Good strength all extremities, no tremor noted, no significant bradykinesia noted, increase rigidity right upper extremity with reinforcement  REFLEXES: Reflexes are 1 and symmetric   COORDINATION: There is no trunk or limb dysmetria noted.  GAIT/STANCE: He can get up from seated position arm crossed, leaning forward, wide-based cautious, steady, mild decreased arm swing on the left, en bloc turning noted.  ( exam was take 2 hours post am dose sinemet 25/100).  DIAGNOSTIC DATA (LABS,  IMAGING, TESTING) - I reviewed patient records, labs, notes, testing and imaging myself where available.  ASSESSMENT AND PLAN  1.  History of stroke 2.  Parkinson's disease  -Benefit with higher dose Sinemet, continue Sinemet 25/100 mg, 2 tablets 3 times a day (less stiff, smoother), didn't increase, feel stabilized at present, already big improvement in mobility, save if needed in future -Continue Plavix with history of stroke -Encouraged to increase activity, exercise for PD -Seeing PCP in the next few weeks -Follow-up in 6 months or sooner if needed with Dr. Krista Blue  I spent 30 minutes of face-to-face and non-face-to-face time with patient.  This included previsit chart review, lab review, study review, order entry, electronic health record documentation, patient education.  Evangeline Dakin, DNP  [facility] [address], [place], [place] [address] [phone]

## 2020-11-21 DIAGNOSIS — E1165 Type 2 diabetes mellitus with hyperglycemia: Secondary | ICD-10-CM | POA: Diagnosis not present

## 2020-11-21 DIAGNOSIS — I25810 Atherosclerosis of coronary artery bypass graft(s) without angina pectoris: Secondary | ICD-10-CM | POA: Diagnosis not present

## 2020-11-21 DIAGNOSIS — I2510 Atherosclerotic heart disease of native coronary artery without angina pectoris: Secondary | ICD-10-CM | POA: Diagnosis not present

## 2020-11-26 DIAGNOSIS — I25810 Atherosclerosis of coronary artery bypass graft(s) without angina pectoris: Secondary | ICD-10-CM | POA: Diagnosis not present

## 2020-11-26 DIAGNOSIS — H3530 Unspecified macular degeneration: Secondary | ICD-10-CM | POA: Diagnosis not present

## 2020-11-26 DIAGNOSIS — I10 Essential (primary) hypertension: Secondary | ICD-10-CM | POA: Diagnosis not present

## 2020-11-26 DIAGNOSIS — Z8673 Personal history of transient ischemic attack (TIA), and cerebral infarction without residual deficits: Secondary | ICD-10-CM | POA: Diagnosis not present

## 2020-11-26 DIAGNOSIS — E782 Mixed hyperlipidemia: Secondary | ICD-10-CM | POA: Diagnosis not present

## 2020-11-26 DIAGNOSIS — J8410 Pulmonary fibrosis, unspecified: Secondary | ICD-10-CM | POA: Diagnosis not present

## 2020-11-26 DIAGNOSIS — I25708 Atherosclerosis of coronary artery bypass graft(s), unspecified, with other forms of angina pectoris: Secondary | ICD-10-CM | POA: Diagnosis not present

## 2020-11-26 DIAGNOSIS — I2510 Atherosclerotic heart disease of native coronary artery without angina pectoris: Secondary | ICD-10-CM | POA: Diagnosis not present

## 2020-11-26 DIAGNOSIS — R634 Abnormal weight loss: Secondary | ICD-10-CM | POA: Diagnosis not present

## 2020-11-26 DIAGNOSIS — Z0001 Encounter for general adult medical examination with abnormal findings: Secondary | ICD-10-CM | POA: Diagnosis not present

## 2020-11-26 DIAGNOSIS — E1169 Type 2 diabetes mellitus with other specified complication: Secondary | ICD-10-CM | POA: Diagnosis not present

## 2020-11-26 DIAGNOSIS — H6123 Impacted cerumen, bilateral: Secondary | ICD-10-CM | POA: Diagnosis not present

## 2020-12-05 DIAGNOSIS — R531 Weakness: Secondary | ICD-10-CM | POA: Insufficient documentation

## 2020-12-05 DIAGNOSIS — I25708 Atherosclerosis of coronary artery bypass graft(s), unspecified, with other forms of angina pectoris: Secondary | ICD-10-CM | POA: Insufficient documentation

## 2020-12-05 DIAGNOSIS — E785 Hyperlipidemia, unspecified: Secondary | ICD-10-CM | POA: Insufficient documentation

## 2020-12-05 DIAGNOSIS — G4733 Obstructive sleep apnea (adult) (pediatric): Secondary | ICD-10-CM | POA: Insufficient documentation

## 2020-12-05 DIAGNOSIS — I25810 Atherosclerosis of coronary artery bypass graft(s) without angina pectoris: Secondary | ICD-10-CM | POA: Insufficient documentation

## 2020-12-05 NOTE — Progress Notes (Signed)
Cardiology Office Note   Date:  12/06/2020   ID:  [name], DOB 09-03-1941, MRN [id number]  PCP:  Victor Squibb, MD  Cardiologist:   Victor Breeding, MD  No chief complaint on file.     History of Present Illness: [name] is a 80 y.o. male who presents for follow up of CABG which he had in 1999 at [facility].  He underwent a normal nuclear stress test on 03/05/2016.  Echocardiogram on 03/05/2016 showed normal left ventricular systolic function, LVEF 50 to 55%, and mild diastolic dysfunction.  Since he was last seen he has been diagnosed with Parkinson's.  Actually looked at some of the work-up for this.  He had an MRI that suggested some small subacute lacunar infarcts in the right cerebellar hemisphere.  Follow-up with this he had transthoracic echo with agitated saline and there were no significant abnormalities.  He had carotid Dopplers without significant stenosis.  He gets around slowly with a shuffling gait and cane.  He denies any new cardiovascular symptoms. The patient denies any new symptoms such as chest discomfort, neck or arm discomfort. There has been no new shortness of breath, PND or orthopnea. There have been no reported palpitations, presyncope or syncope.   Past Medical History:  Diagnosis Date  . Abnormal gait   . Fatigue   . Hypertension   . Prostate cancer ([place])   . Tremor   . Vitamin D deficiency     Past Surgical History:  Procedure Laterality Date  . CARDIAC SURGERY     bypass  . COLONOSCOPY N/A 02/09/2017   Procedure: COLONOSCOPY;  Surgeon: Victor Signs, MD;  [facility];  Service: Gastroenterology;  Laterality: N/A;  . HEMORROIDECTOMY    . KNEE SURGERY Right      Current Outpatient Medications  Medication Sig Dispense Refill  . amLODipine-benazepril (LOTREL) 5-20 MG capsule Take 1 capsule by mouth daily.    . carbidopa-levodopa (SINEMET IR) 25-100 MG tablet TAKE TWO TABLETS BY MOUTH THREE TIMES DAILY 180 tablet 4  .  cetirizine (ZYRTEC) 10 MG tablet Take 10 mg by mouth daily as needed for allergies.    Marland Kitchen clopidogrel (PLAVIX) 75 MG tablet Take 1 tablet (75 mg total) by mouth daily. 90 tablet 4  . glipiZIDE (GLUCOTROL) 5 MG tablet Take 5 mg by mouth daily before breakfast.    . metFORMIN (GLUCOPHAGE) 1000 MG tablet Take 1,000 mg by mouth 2 (two) times daily with a meal.  3  . Multiple Vitamins-Minerals (PRESERVISION AREDS 2+MULTI VIT) CAPS Take by mouth.    . nitroGLYCERIN (NITROSTAT) 0.4 MG SL tablet Place 1 tablet (0.4 mg total) under the tongue every 5 (five) minutes as needed for chest pain. 25 tablet 3  . simvastatin (ZOCOR) 10 MG tablet Take 10 mg by mouth daily.     No current facility-administered medications for this visit.    Allergies:   Patient has no known allergies.    ROS:  Please see the history of present illness.   Otherwise, review of systems are positive for none.   All other systems are reviewed and negative.    PHYSICAL EXAM: VS:  BP 138/74   Pulse 83   Ht 5\' 9"  (1.753 m)   Wt 185 lb (83.9 kg)   SpO2 98%   BMI 27.32 kg/m  , BMI Body mass index is 27.32 kg/m. GENERAL:  Well appearing NECK:  No jugular venous distention, waveform within normal limits, carotid  upstroke brisk and symmetric, no bruits, no thyromegaly LUNGS:  Clear to auscultation bilaterally CHEST:  Well healed sternotomy scar. HEART:  PMI not displaced or sustained,S1 and S2 within normal limits, no S3, no S4, no clicks, no rubs, no murmurs ABD:  Flat, positive bowel sounds normal in frequency in pitch, no bruits, no rebound, no guarding, no midline pulsatile mass, no hepatomegaly, no splenomegaly EXT:  2 plus pulses throughout, no edema, no cyanosis no clubbing   EKG:  EKG is ordered today. The ekg ordered today demonstrates normal sinus rhythm, rate 79, axis within normal limits, intervals within normal limits, no acute ST-T wave changes.   Recent Labs: No results found for requested labs within last 8760  hours.    Lipid Panel No results found for: CHOL, TRIG, HDL, CHOLHDL, VLDL, LDLCALC, LDLDIRECT    Wt Readings from Last 3 Encounters:  12/06/20 185 lb (83.9 kg)  10/24/20 190 lb (86.2 kg)  04/23/20 190 lb 8 oz (86.4 kg)      Other studies Reviewed: Additional studies/ records that were reviewed today include: Echo, MRI. Review of the above records demonstrates:  Please see elsewhere in the note.     ASSESSMENT AND PLAN:  Coronary artery disease: History of CABG:    The patient has no new sypmtoms.  No further cardiovascular testing is indicated.  We will continue with aggressive risk reduction and meds as listed.  Hypertension:  BP is at target.  No change in therapy.    Hypercholesterolemia: I do not have his most recent lipids.  However, I did review with him the fact that his target LDL is less than 70 and I will defer to Dr. [place] Day.   Type 2 diabetes mellitus: 6.1.  No change in therapy.   OSA:   He said it was not severe enough to need treatment although there is a mention that he did not want CPAP.  Either way his wife that he does not snore any sleeps well.    Current medicines are reviewed at length with the patient today.  The patient does not have concerns regarding medicines.  The following changes have been made:  no change  Labs/ tests ordered today include:   Orders Placed This Encounter  Procedures  . EKG 12-Lead     Disposition:   FU with me or Eden MD in one year.     Signed, Victor Breeding, MD  12/06/2020 3:36 PM    [facility]

## 2020-12-06 ENCOUNTER — Encounter: Payer: Self-pay | Admitting: Cardiology

## 2020-12-06 ENCOUNTER — Ambulatory Visit: Payer: Medicare Other | Admitting: Cardiology

## 2020-12-06 VITALS — BP 138/74 | HR 83 | Ht 69.0 in | Wt 185.0 lb

## 2020-12-06 DIAGNOSIS — G4733 Obstructive sleep apnea (adult) (pediatric): Secondary | ICD-10-CM | POA: Diagnosis not present

## 2020-12-06 DIAGNOSIS — I10 Essential (primary) hypertension: Secondary | ICD-10-CM

## 2020-12-06 DIAGNOSIS — R531 Weakness: Secondary | ICD-10-CM

## 2020-12-06 DIAGNOSIS — I25708 Atherosclerosis of coronary artery bypass graft(s), unspecified, with other forms of angina pectoris: Secondary | ICD-10-CM | POA: Diagnosis not present

## 2020-12-06 DIAGNOSIS — E785 Hyperlipidemia, unspecified: Secondary | ICD-10-CM | POA: Diagnosis not present

## 2020-12-06 NOTE — Patient Instructions (Addendum)

## 2020-12-18 DIAGNOSIS — H353211 Exudative age-related macular degeneration, right eye, with active choroidal neovascularization: Secondary | ICD-10-CM | POA: Diagnosis not present

## 2020-12-18 DIAGNOSIS — H43811 Vitreous degeneration, right eye: Secondary | ICD-10-CM | POA: Diagnosis not present

## 2020-12-18 DIAGNOSIS — H353222 Exudative age-related macular degeneration, left eye, with inactive choroidal neovascularization: Secondary | ICD-10-CM | POA: Diagnosis not present

## 2020-12-18 DIAGNOSIS — H35372 Puckering of macula, left eye: Secondary | ICD-10-CM | POA: Diagnosis not present

## 2021-02-11 ENCOUNTER — Ambulatory Visit: Payer: Medicare Other | Admitting: Neurology

## 2021-02-12 DIAGNOSIS — H35372 Puckering of macula, left eye: Secondary | ICD-10-CM | POA: Diagnosis not present

## 2021-02-12 DIAGNOSIS — H43811 Vitreous degeneration, right eye: Secondary | ICD-10-CM | POA: Diagnosis not present

## 2021-02-12 DIAGNOSIS — H353211 Exudative age-related macular degeneration, right eye, with active choroidal neovascularization: Secondary | ICD-10-CM | POA: Diagnosis not present

## 2021-02-12 DIAGNOSIS — H353222 Exudative age-related macular degeneration, left eye, with inactive choroidal neovascularization: Secondary | ICD-10-CM | POA: Diagnosis not present

## 2021-02-13 ENCOUNTER — Ambulatory Visit: Payer: Medicare Other | Admitting: Neurology

## 2021-03-06 ENCOUNTER — Other Ambulatory Visit: Payer: Self-pay | Admitting: Neurology

## 2021-03-26 DIAGNOSIS — E559 Vitamin D deficiency, unspecified: Secondary | ICD-10-CM | POA: Diagnosis not present

## 2021-03-26 DIAGNOSIS — I10 Essential (primary) hypertension: Secondary | ICD-10-CM | POA: Diagnosis not present

## 2021-03-26 DIAGNOSIS — E1165 Type 2 diabetes mellitus with hyperglycemia: Secondary | ICD-10-CM | POA: Diagnosis not present

## 2021-03-26 DIAGNOSIS — E611 Iron deficiency: Secondary | ICD-10-CM | POA: Diagnosis not present

## 2021-04-09 DIAGNOSIS — H353222 Exudative age-related macular degeneration, left eye, with inactive choroidal neovascularization: Secondary | ICD-10-CM | POA: Diagnosis not present

## 2021-04-09 DIAGNOSIS — H353211 Exudative age-related macular degeneration, right eye, with active choroidal neovascularization: Secondary | ICD-10-CM | POA: Diagnosis not present

## 2021-04-09 DIAGNOSIS — H35372 Puckering of macula, left eye: Secondary | ICD-10-CM | POA: Diagnosis not present

## 2021-04-09 DIAGNOSIS — D3131 Benign neoplasm of right choroid: Secondary | ICD-10-CM | POA: Diagnosis not present

## 2021-04-15 DIAGNOSIS — Z8673 Personal history of transient ischemic attack (TIA), and cerebral infarction without residual deficits: Secondary | ICD-10-CM | POA: Diagnosis not present

## 2021-04-15 DIAGNOSIS — E1165 Type 2 diabetes mellitus with hyperglycemia: Secondary | ICD-10-CM | POA: Diagnosis not present

## 2021-04-15 DIAGNOSIS — R269 Unspecified abnormalities of gait and mobility: Secondary | ICD-10-CM | POA: Diagnosis not present

## 2021-04-15 DIAGNOSIS — Z0001 Encounter for general adult medical examination with abnormal findings: Secondary | ICD-10-CM | POA: Diagnosis not present

## 2021-04-15 DIAGNOSIS — I10 Essential (primary) hypertension: Secondary | ICD-10-CM | POA: Diagnosis not present

## 2021-04-15 DIAGNOSIS — I2510 Atherosclerotic heart disease of native coronary artery without angina pectoris: Secondary | ICD-10-CM | POA: Diagnosis not present

## 2021-04-15 DIAGNOSIS — H3530 Unspecified macular degeneration: Secondary | ICD-10-CM | POA: Diagnosis not present

## 2021-04-15 DIAGNOSIS — E782 Mixed hyperlipidemia: Secondary | ICD-10-CM | POA: Diagnosis not present

## 2021-04-15 DIAGNOSIS — Z0000 Encounter for general adult medical examination without abnormal findings: Secondary | ICD-10-CM | POA: Diagnosis not present

## 2021-04-24 ENCOUNTER — Ambulatory Visit: Payer: Medicare Other | Admitting: Neurology

## 2021-05-05 ENCOUNTER — Other Ambulatory Visit: Payer: Self-pay | Admitting: Neurology

## 2021-05-12 ENCOUNTER — Encounter: Payer: Self-pay | Admitting: Neurology

## 2021-05-12 ENCOUNTER — Ambulatory Visit: Payer: Medicare Other | Admitting: Neurology

## 2021-05-12 VITALS — BP 172/55 | HR 78 | Ht 69.0 in | Wt 172.0 lb

## 2021-05-12 DIAGNOSIS — R269 Unspecified abnormalities of gait and mobility: Secondary | ICD-10-CM | POA: Diagnosis not present

## 2021-05-12 DIAGNOSIS — I639 Cerebral infarction, unspecified: Secondary | ICD-10-CM | POA: Diagnosis not present

## 2021-05-12 DIAGNOSIS — G20 Parkinson's disease: Secondary | ICD-10-CM | POA: Diagnosis not present

## 2021-05-12 MED ORDER — CARBIDOPA-LEVODOPA 25-100 MG PO TABS: 2.0000 | ORAL_TABLET | Freq: Four times a day (QID) | ORAL | 3 refills | Status: DC

## 2021-05-12 MED ORDER — RASAGILINE MESYLATE 1 MG PO TABS: 1.0000 mg | ORAL_TABLET | Freq: Every day | ORAL | 0 refills | Status: DC

## 2021-05-12 NOTE — Progress Notes (Signed)
PATIENT: [name] DOB: 15-Jul-1941  Chief Complaint  Patient presents with   Follow-up    With wife, states he is walking slower, stiffness in both legs      ASSESSMENT AND PLAN  History of stroke Parkinson's disease  Reported wearing off, will increase Sinemet 25/100 mg to 2 tablets 4 times a day, at 9, 12, 3, 6 PM,  Continue Plavix for stroke prevention   DIAGNOSTIC DATA (LABS, IMAGING, TESTING) - I reviewed patient records, labs, notes, testing and imaging myself where available.   This MRI of the brain without contrast in March 2021: 1.   Small subacute lacunar infarction in the right cerebellar hemisphere.  There are also other chronic microvascular ischemic changes in the right middle cerebellar peduncle, left pons in the subcortical, periventricular and deep white matter of the hemispheres.  None of these other foci appear to be acute. 2.   Artifact, likely metal, in the midline frontal region near the vertex of the skull.  This MRI of the cervical spine without contrast shows the following: 1.   The spinal cord appears normal. 2.    Mild degenerative changes at C3-C4, C4-C5 and C5-C6 that do not lead to nerve root compression or spinal stenosis.    HISTORICAL  [name] is a 80 year old male, seen in request by his primary care physician Dr. Allyn Kenner for evaluation of gait abnormality, he is accompanied by his wife at today's clinical visit on November 07, 2019.  I have reviewed and summarized the referring note from the referring physician.  He had a past medical history of hypertension, hyperlipidemia, diabetes, coronary artery disease,  He was previously very active, "very physical", enjoying his yard work in the summertime, around November 2020, he noticed gradual onset gait abnormality, fell few times, acute worsening since he fell at the end of December 2020, he fell in the bathtub on his back, following that fall, he had 2 weeks history of urinary  incontinence, now gradually improved, but his gait abnormality remains, he was noted to have small stride, careful, stiff gait,  He denied loss of sense of smell, denies REM sleep disorder or memory loss, denies hand tremor, denies bilateral upper or lower extremity paresthesia, denies significant pain, he denies double vision, swallowing difficulty, ptosis,  Laboratory evaluations in January 2021, normal CBC hemoglobin of 13.9, CMP, creatinine of 0.94, LDL was 68, A1c was 7.0,  UPDATE December 21 2019: He continue complains of unsteady gait, seems to have more trouble on his right side  MRI of brain in March 2021, small subacute lacunar infarction in the right cerebellum hemisphere,also chronic microvascular ischemic changes in the right middle cerebellar peduncle, left pons, subcortical, periventricular, deep white matter's,  MRI of cervical spine showed multilevel degenerative changes, but there was no evidence of spinal cord or canal stenosis.  Laboratory evaluation February 2021, normal Q03, RPR, folic acid, C-reactive protein, TSH, CPK, ESR, ANA, acetylcholine receptor antibody  UPDATE April 23 2020: Snienemt has helped his walking, reported mild to moderate improvement, taking it at 9 AM, 1 PM, 6 PM, noticed elevated 30 minutes after medication, also noticed wearing off before the next dose, he is not ambulated with a cane, physical therapy was helpful as well  He denied loss sense of smell, no REM sleep disorder, no significant side effect from Sinemet dose to try higher dose  Echocardiogram showed ejection fraction 50 to 55%, no right-to-left shunt by bubble study Ultrasound of carotid artery  showed less than 39% stenosis bilaterally, is taking Plavix 75 mg only  UPDATE May 12 2021: He fell in tub, had incontinence, quit driving in Feb 1017.  Now almost back to his baseline, but overall slow decline, He took sinemet 9am, 2pm, 6pm,  it does help him moves better, he noticed wearing  off, also noticed worsening memory loss  Ultrasound of carotid artery May 2021 less than 39% stenosis bilaterally, antegrade flow bilateral vertebral artery  Echocardiogram in May 2021, no evidence of intracardiac shunting  PHYSICAL EXAM   Vitals:   05/12/21 1313  BP: (!) 172/55  Pulse: 78  Weight: 172 lb (78 kg)  Height: _0  (1.753 m)   Not recorded     Body mass index is 25.4 kg/m.  PHYSICAL EXAMNIATION:  Gen: NAD, conversant, well nourised, well groomed                     Cardiovascular: Regular rate rhythm, no peripheral edema, warm, nontender. Pulmonary: Clear to auscultation bilaterally   NEUROLOGICAL EXAM:  MENTAL STATUS: Speech/Cognition: Awake, alert, oriented to history taking and casual conversation, mild hard of hearling   Montreal Cognitive Assessment  05/13/2021  Visuospatial/ Executive (0/5) 1  Naming (0/3) 3  Attention: Read list of digits (0/2) 2  Attention: Read list of letters (0/1) 0  Attention: Serial 7 subtraction starting at 100 (0/3) 2  Language: Repeat phrase (0/2) 2  Language : Fluency (0/1) 0  Abstraction (0/2) 2  Delayed Recall (0/5) 0  Orientation (0/6) 4  Total 16       CRANIAL NERVES: CN II: Visual fields are full to confrontation. Pupils are round equal and briskly reactive to light. CN III, IV, VI: extraocular movement are normal. No ptosis. CN V: Facial sensation is intact to light touch CN VII: Face is symmetric with normal eye closure  CN VIII: Hard of hearing CN IX, X: Phonation is normal. CN XI: Head turning and shoulder shrug are intact  MOTOR: No weakness, mild right more than left bradykinesia and rigidity  REFLEXES: Reflexes are 1 and symmetric   COORDINATION: There is no trunk or limb dysmetria noted.  GAIT/STANCE: He can get up from seated position arm crossed, leaning forward, wide-based cautious, steady, mild decreased arm swing on the left, en bloc turning     REVIEW OF SYSTEMS: Full 14 system review  of systems performed and notable only for as above All other review of systems were negative.  Walking difficulty  ALLERGIES: No Known Allergies  HOME MEDICATIONS: Current Outpatient Medications  Medication Sig Dispense Refill   amLODipine-benazepril (LOTREL) 5-20 MG capsule Take 1 capsule by mouth daily.     carbidopa-levodopa (SINEMET IR) 25-100 MG tablet TAKE 2 TABLETS BY MOUTH THREE TIMES DAILY 180 tablet 1   cetirizine (ZYRTEC) 10 MG tablet Take 10 mg by mouth daily as needed for allergies.     clopidogrel (PLAVIX) 75 MG tablet Take 1 tablet (75 mg total) by mouth daily. 90 tablet 4   metFORMIN (GLUCOPHAGE) 1000 MG tablet Take 1,000 mg by mouth 2 (two) times daily with a meal.  3   Multiple Vitamins-Minerals (PRESERVISION AREDS 2+MULTI VIT) CAPS Take by mouth.     simvastatin (ZOCOR) 10 MG tablet Take 10 mg by mouth daily.     nitroGLYCERIN (NITROSTAT) 0.4 MG SL tablet Place 1 tablet (0.4 mg total) under the tongue every 5 (five) minutes as needed for chest pain. 25 tablet 3   No current  facility-administered medications for this visit.    PAST MEDICAL HISTORY: Past Medical History:  Diagnosis Date   Abnormal gait    Fatigue    Hypertension    Prostate cancer ([place])    Tremor    Vitamin D deficiency     PAST SURGICAL HISTORY: Past Surgical History:  Procedure Laterality Date   CARDIAC SURGERY     bypass   COLONOSCOPY N/A 02/09/2017   Procedure: COLONOSCOPY;  Surgeon: Aviva Signs, MD;  Location[facility];  Service: Gastroenterology;  Laterality: N/A;   HEMORROIDECTOMY     KNEE SURGERY Right     FAMILY HISTORY: Family History  Problem Relation Age of Onset   Dementia Mother    Diabetes Father    Lung cancer Father    Breast cancer Sister    Multiple myeloma Brother     SOCIAL HISTORY: Social History   Socioeconomic History   Marital status: Married    Spouse name: Not on file   Number of children: 1   Years of education: 12   Highest education  level: High school graduate  Occupational History   Occupation: Retired  Tobacco Use   Smoking status: Former    Packs/day: 1.00    Years: 9.00    Pack years: 9.00    Types: Cigarettes    Quit date: 02/25/1971    Years since quitting: 50.2   Smokeless tobacco: Former    Types: Snuff  Vaping Use   Vaping Use: Never used  Substance and Sexual Activity   Alcohol use: No    Alcohol/week: 0.0 standard drinks   Drug use: No   Sexual activity: Not on file  Other Topics Concern   Not on file  Social History Narrative   Lives at home with his wife.   Right-handed.   2 cups caffeine per day.   Social Determinants of Health   Financial Resource Strain: Not on file  Food Insecurity: Not on file  Transportation Needs: Not on file  Physical Activity: Not on file  Stress: Not on file  Social Connections: Not on file  Intimate Partner Violence: Not on file    Marcial Pacas, M.D. Ph.D.  [facility] [place], [place] [address] Phone: [phone] Fax:      [phone]

## 2021-05-28 IMAGING — CT CT ABD-PELV W/ CM
2 of 5 series · 14 of 46 positions shown, 16 images · IV contrast (Omnipaque or Isovue)
Comparison: No priors.

CLINICAL DATA: 78-year-old male with history of abnormal weight
loss of 10 pounds over 2 months.

EXAM:
CT ABDOMEN AND PELVIS WITH CONTRAST
TECHNIQUE: Multidetector CT imaging of the abdomen and pelvis was performed
using the standard protocol following bolus administration of
intravenous contrast.
CONTRAST:  100mL OMNIPAQUE IOHEXOL 300 MG/ML  SOLN

[Series 2: axial st · axial · 0.77mm/px · z∈[+732,+1202]mm · 11 of 110 slices shown, 13 images]
[im 8/110  soft-tissue]
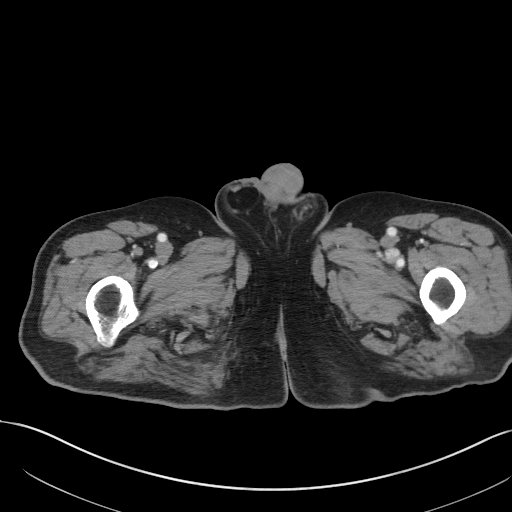
[im 8/110  bone]
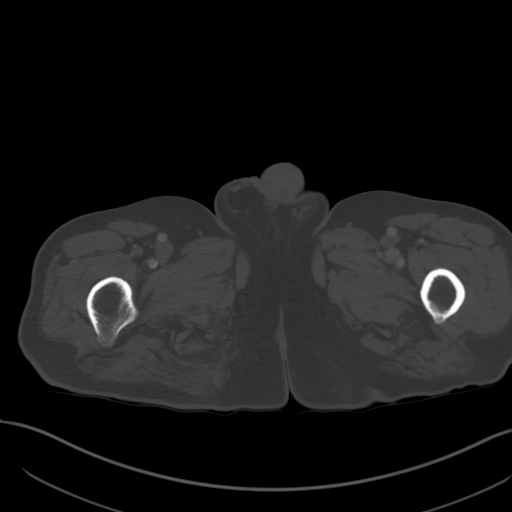
[im 16/110  soft-tissue]
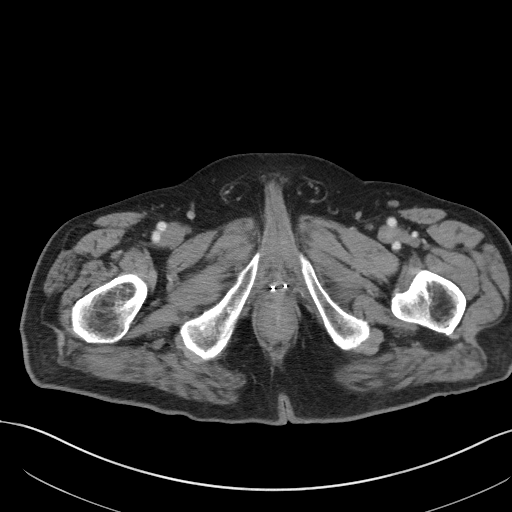
[im 24/110  soft-tissue]
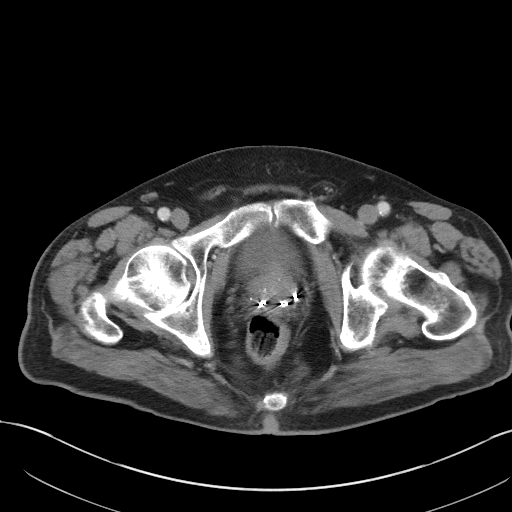
[im 39/110  soft-tissue]
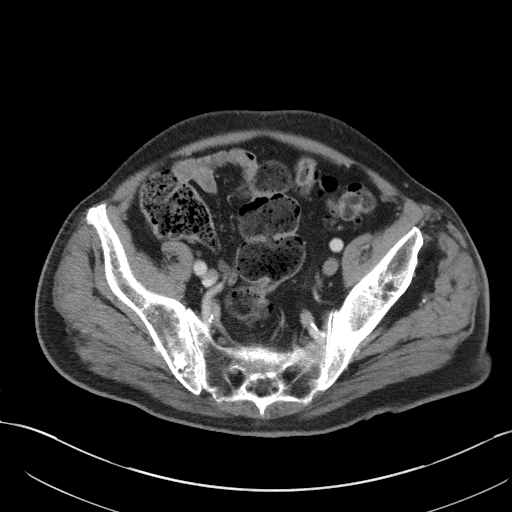
[im 47/110  soft-tissue]
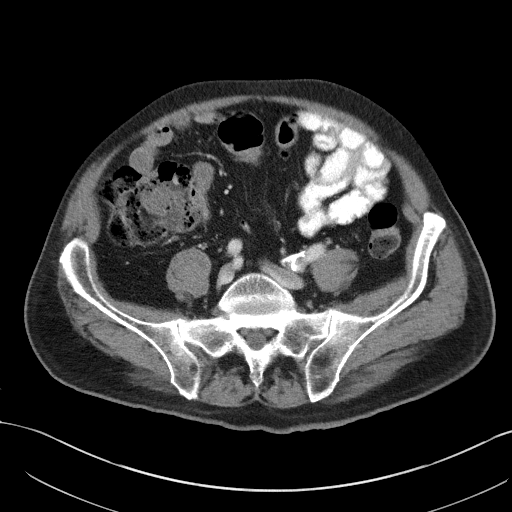
[im 55/110  soft-tissue]
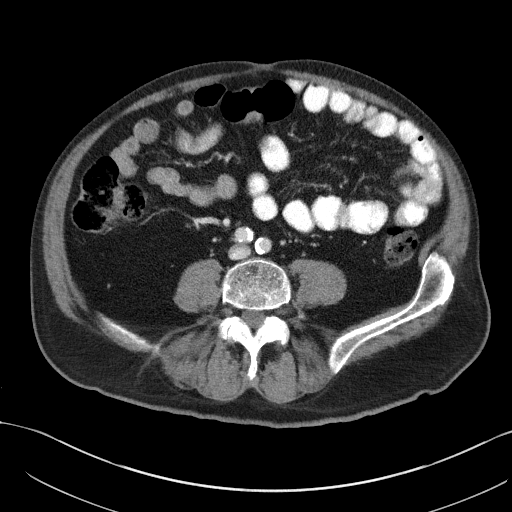
[im 63/110  soft-tissue]
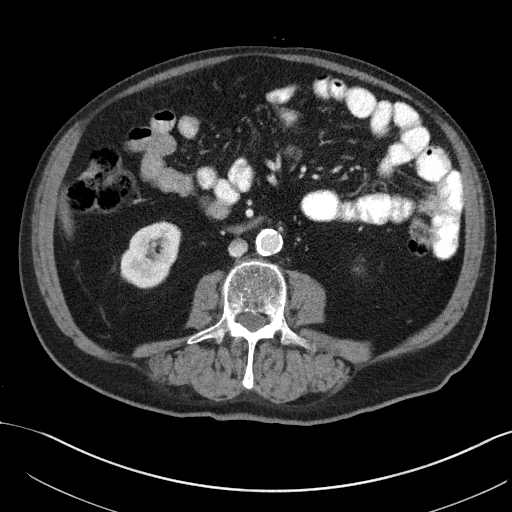
[im 71/110  soft-tissue]
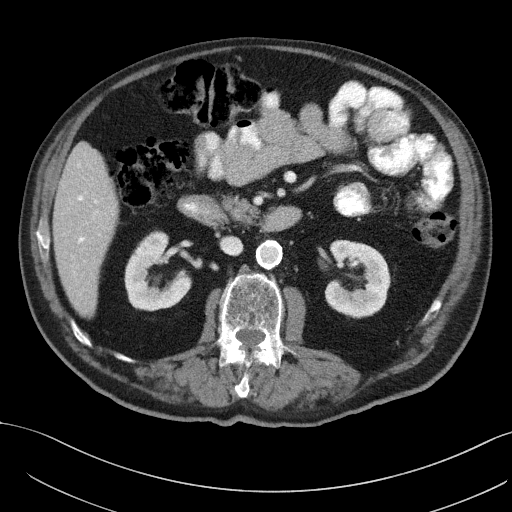
[im 86/110  soft-tissue]
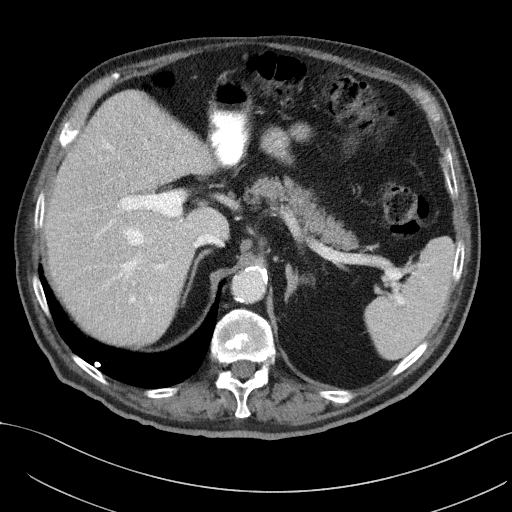
[im 86/110  bone]
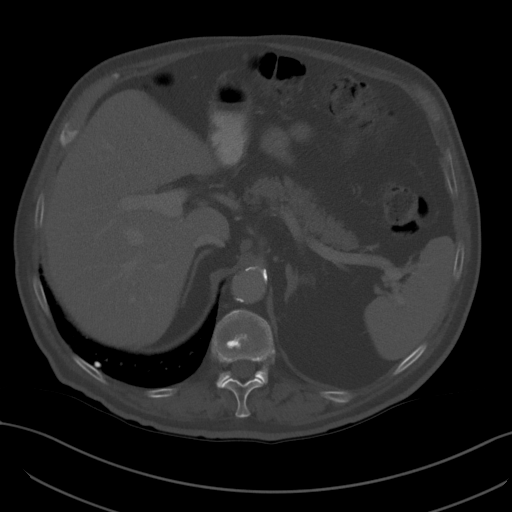
[im 94/110  soft-tissue]
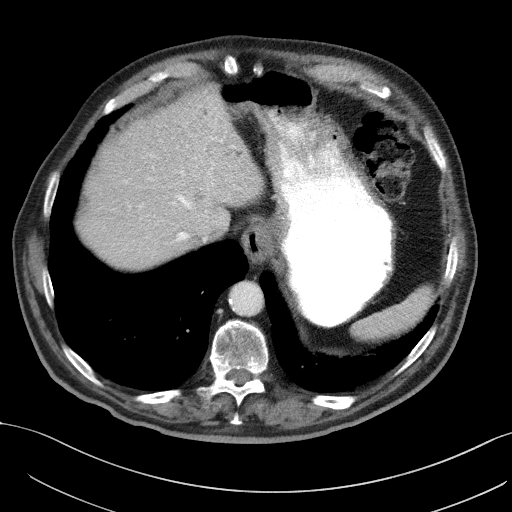
[im 102/110  soft-tissue]
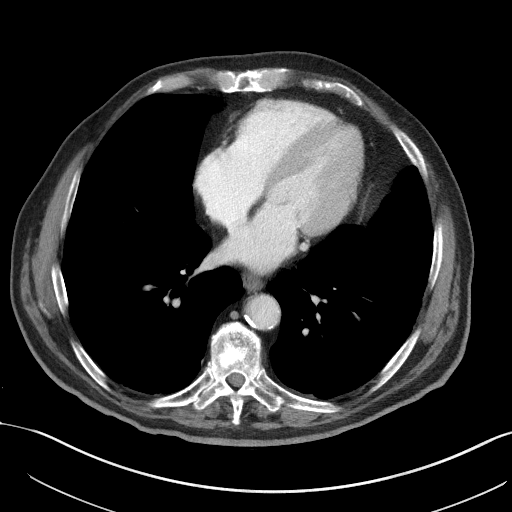

[Series 6: coronal st · coronal · 0.76mm/px · 3 of 117 slices shown]
[im 39/117  soft-tissue]
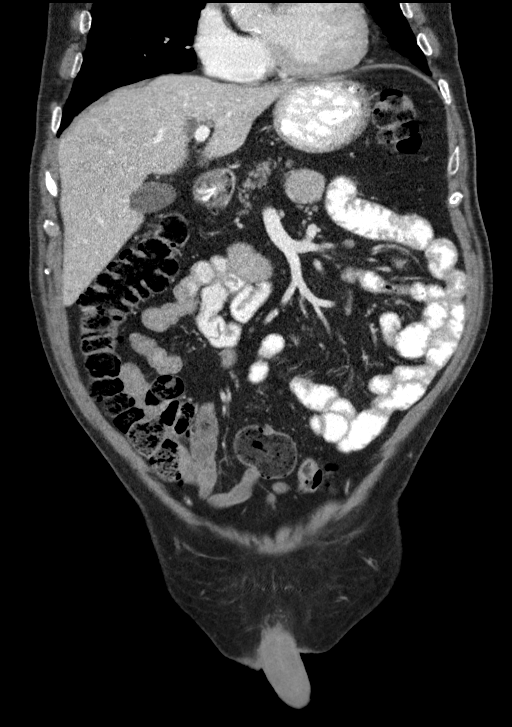
[im 52/117  soft-tissue]
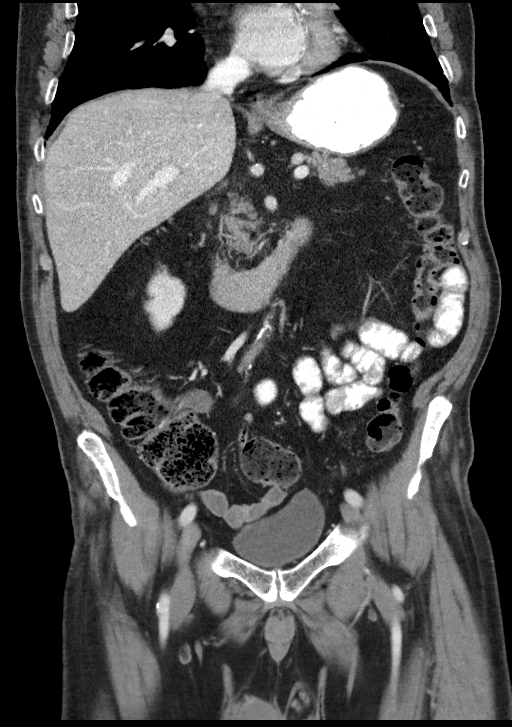
[im 65/117  soft-tissue]
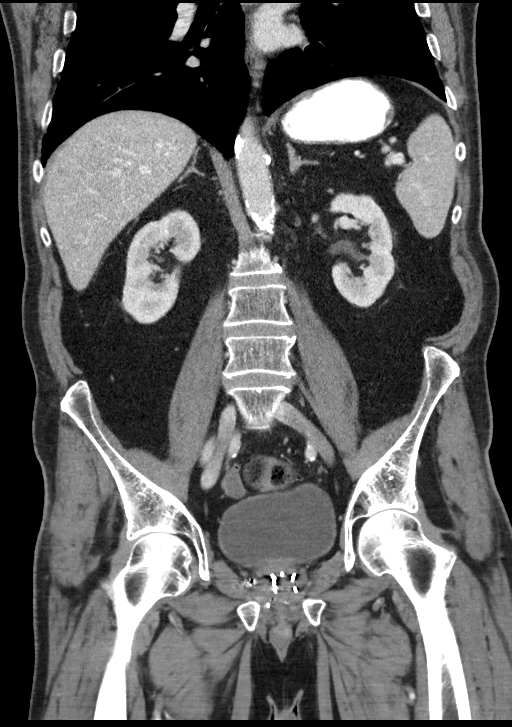

[14 of 46 positions shown; findings below may reference images not displayed]

FINDINGS: Lower chest: Areas of ground-glass attenuation and septal thickening
in the visualized lung bases bilaterally. Atherosclerotic
calcifications in the descending thoracic aorta as well as the left
circumflex and right coronary arteries.

Hepatobiliary: Subcentimeter low-attenuation lesions in the liver,
too small to characterize, but statistically likely to represent
cysts. In the central aspect of segment 4A of the liver there is a
2.4 x 1.9 cm low-attenuation lesion compatible with a simple cyst.
No other suspicious hepatic lesions. Multiple partially calcified
gallstones are noted in the gallbladder measuring up to 1 cm in
diameter. Gallbladder is not distended, nor is there pericholecystic
fluid or inflammatory changes to suggest an acute cholecystitis at
this time.

Pancreas: No pancreatic mass. No pancreatic ductal dilatation. No
pancreatic or peripancreatic fluid collections or inflammatory
changes.

Spleen: Small hypervascular lesion in the liver which maintained
similar attenuation to blood pool on delayed images measuring 1.8 x
1.5 cm (axial image 30 of series 2), likely a small hemangioma.

Adrenals/Urinary Tract: Bilateral kidneys and the right adrenal
gland are normal in appearance. 1 cm fatty attenuation lesion in the
left adrenal gland, compatible with a small adrenal myelolipoma.
Urinary bladder is normal in appearance.

Stomach/Bowel: Normal appearance of the stomach. No pathologic
dilatation of small bowel or colon. Numerous colonic diverticulae
are noted, without surrounding inflammatory changes to suggest an
acute diverticulitis at this time. Normal appendix.

Vascular/Lymphatic: Aortic atherosclerosis, without evidence of
aneurysm or dissection in the abdominal or pelvic vasculature. No
lymphadenopathy noted in the abdomen or pelvis.

Reproductive: Brachytherapy implants throughout the prostate gland.

Other: No significant volume of ascites.  No pneumoperitoneum.

Musculoskeletal: Median sternotomy wires. Lucent lesion with
sclerotic margins and narrow zone of transition in the right ilium,
stable dating back to 5127, incompletely characterized but
presumably benign. There are no aggressive appearing lytic or
blastic lesions noted in the visualized portions of the skeleton.
IMPRESSION: 1. No explanation for the patient's weight loss identified in the
abdomen or pelvis.
2. Cholelithiasis without evidence of acute cholecystitis at this
time.
3. Small 1 cm left adrenal myelolipoma incidentally noted.
4. Mild colonic diverticulosis without evidence of acute
diverticulitis at this time.
5. Aortic atherosclerosis, in addition to at least 3 vessel coronary
artery disease. Assessment for potential risk factor modification,
dietary therapy or pharmacologic therapy may be warranted, if
clinically indicated.
6. Changes in the visualize lung bases concerning for potential
interstitial lung disease. Outpatient referral to Pulmonology for
further evaluation is suggested in the near future.

## 2021-06-11 DIAGNOSIS — H353231 Exudative age-related macular degeneration, bilateral, with active choroidal neovascularization: Secondary | ICD-10-CM | POA: Diagnosis not present

## 2021-06-11 DIAGNOSIS — H35372 Puckering of macula, left eye: Secondary | ICD-10-CM | POA: Diagnosis not present

## 2021-06-11 DIAGNOSIS — H26492 Other secondary cataract, left eye: Secondary | ICD-10-CM | POA: Diagnosis not present

## 2021-06-11 DIAGNOSIS — D3131 Benign neoplasm of right choroid: Secondary | ICD-10-CM | POA: Diagnosis not present

## 2021-06-16 ENCOUNTER — Other Ambulatory Visit: Payer: Self-pay | Admitting: Neurology

## 2021-07-22 DIAGNOSIS — H43811 Vitreous degeneration, right eye: Secondary | ICD-10-CM | POA: Diagnosis not present

## 2021-07-22 DIAGNOSIS — D3131 Benign neoplasm of right choroid: Secondary | ICD-10-CM | POA: Diagnosis not present

## 2021-07-22 DIAGNOSIS — H353231 Exudative age-related macular degeneration, bilateral, with active choroidal neovascularization: Secondary | ICD-10-CM | POA: Diagnosis not present

## 2021-07-22 DIAGNOSIS — H26492 Other secondary cataract, left eye: Secondary | ICD-10-CM | POA: Diagnosis not present

## 2021-07-22 DIAGNOSIS — H35372 Puckering of macula, left eye: Secondary | ICD-10-CM | POA: Diagnosis not present

## 2021-08-05 IMAGING — DX DG CHEST 2V
2 series · 2 of 2 positions shown · non-contrast
Comparison: 4288

CLINICAL DATA: Possible silicosis

EXAM:
CHEST - 2 VIEW

[chest pa]
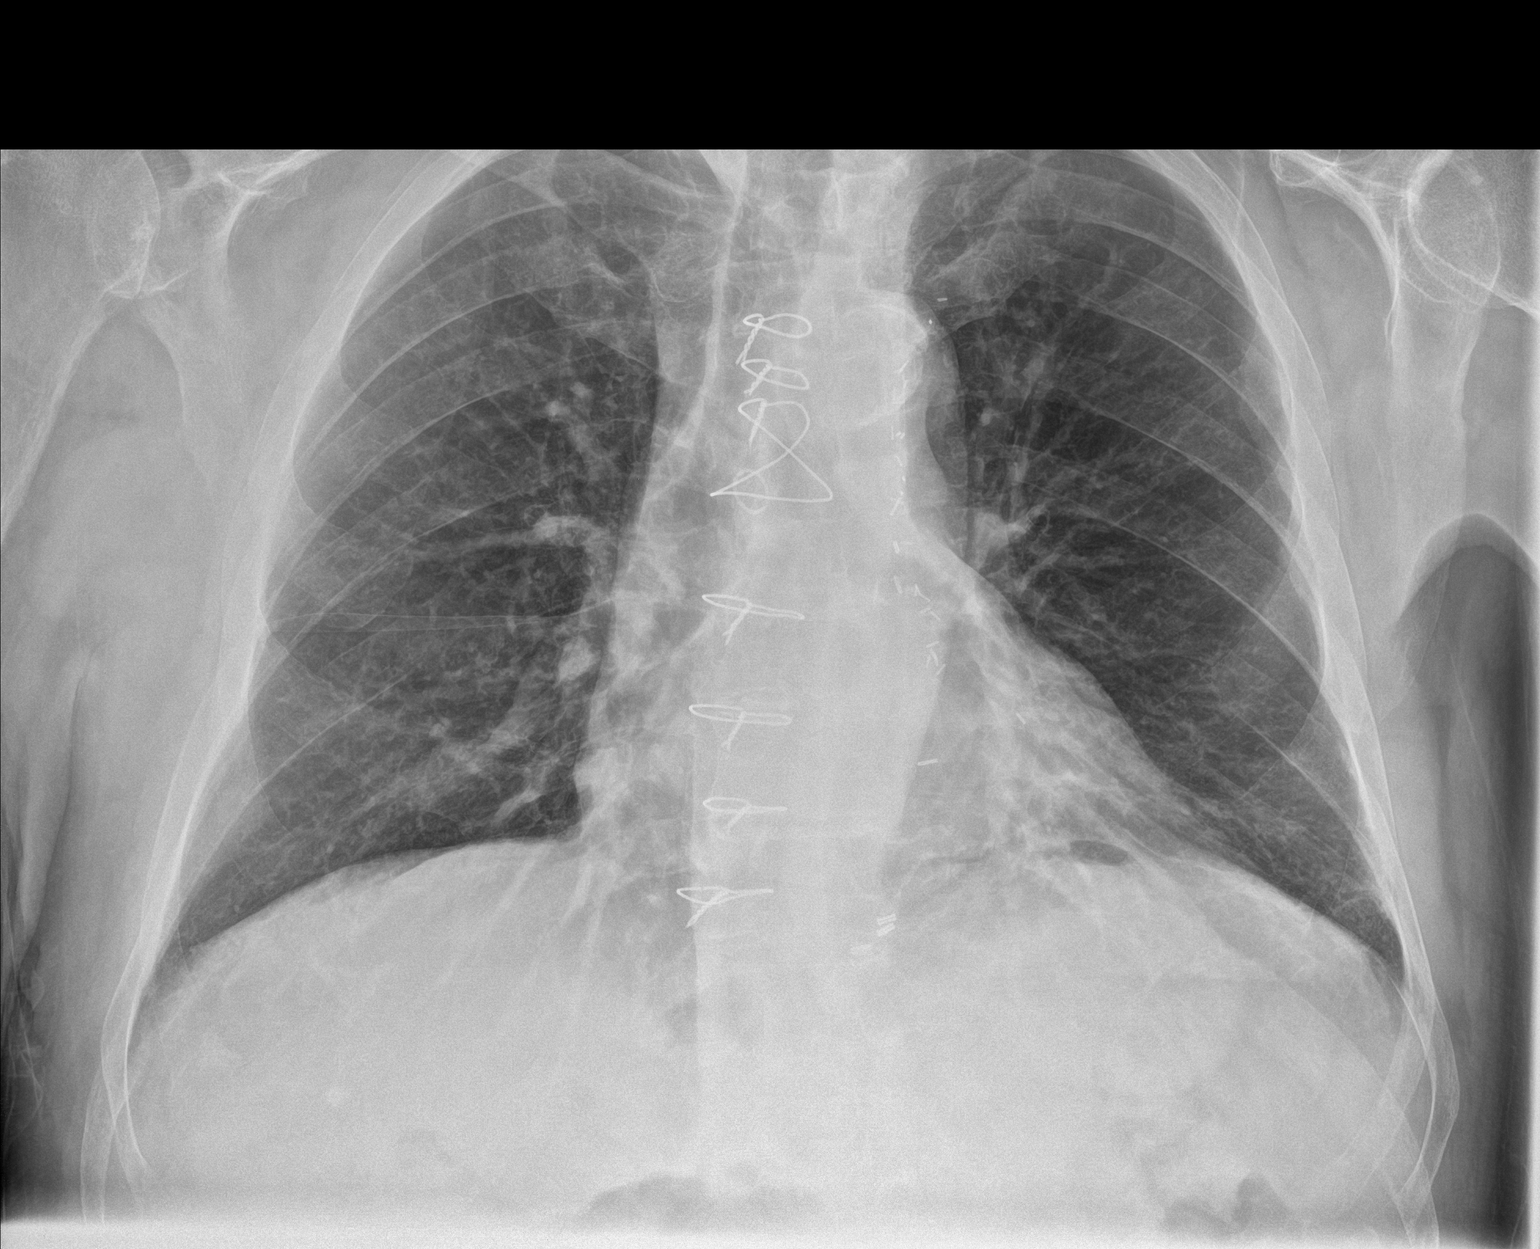

[chest lat]
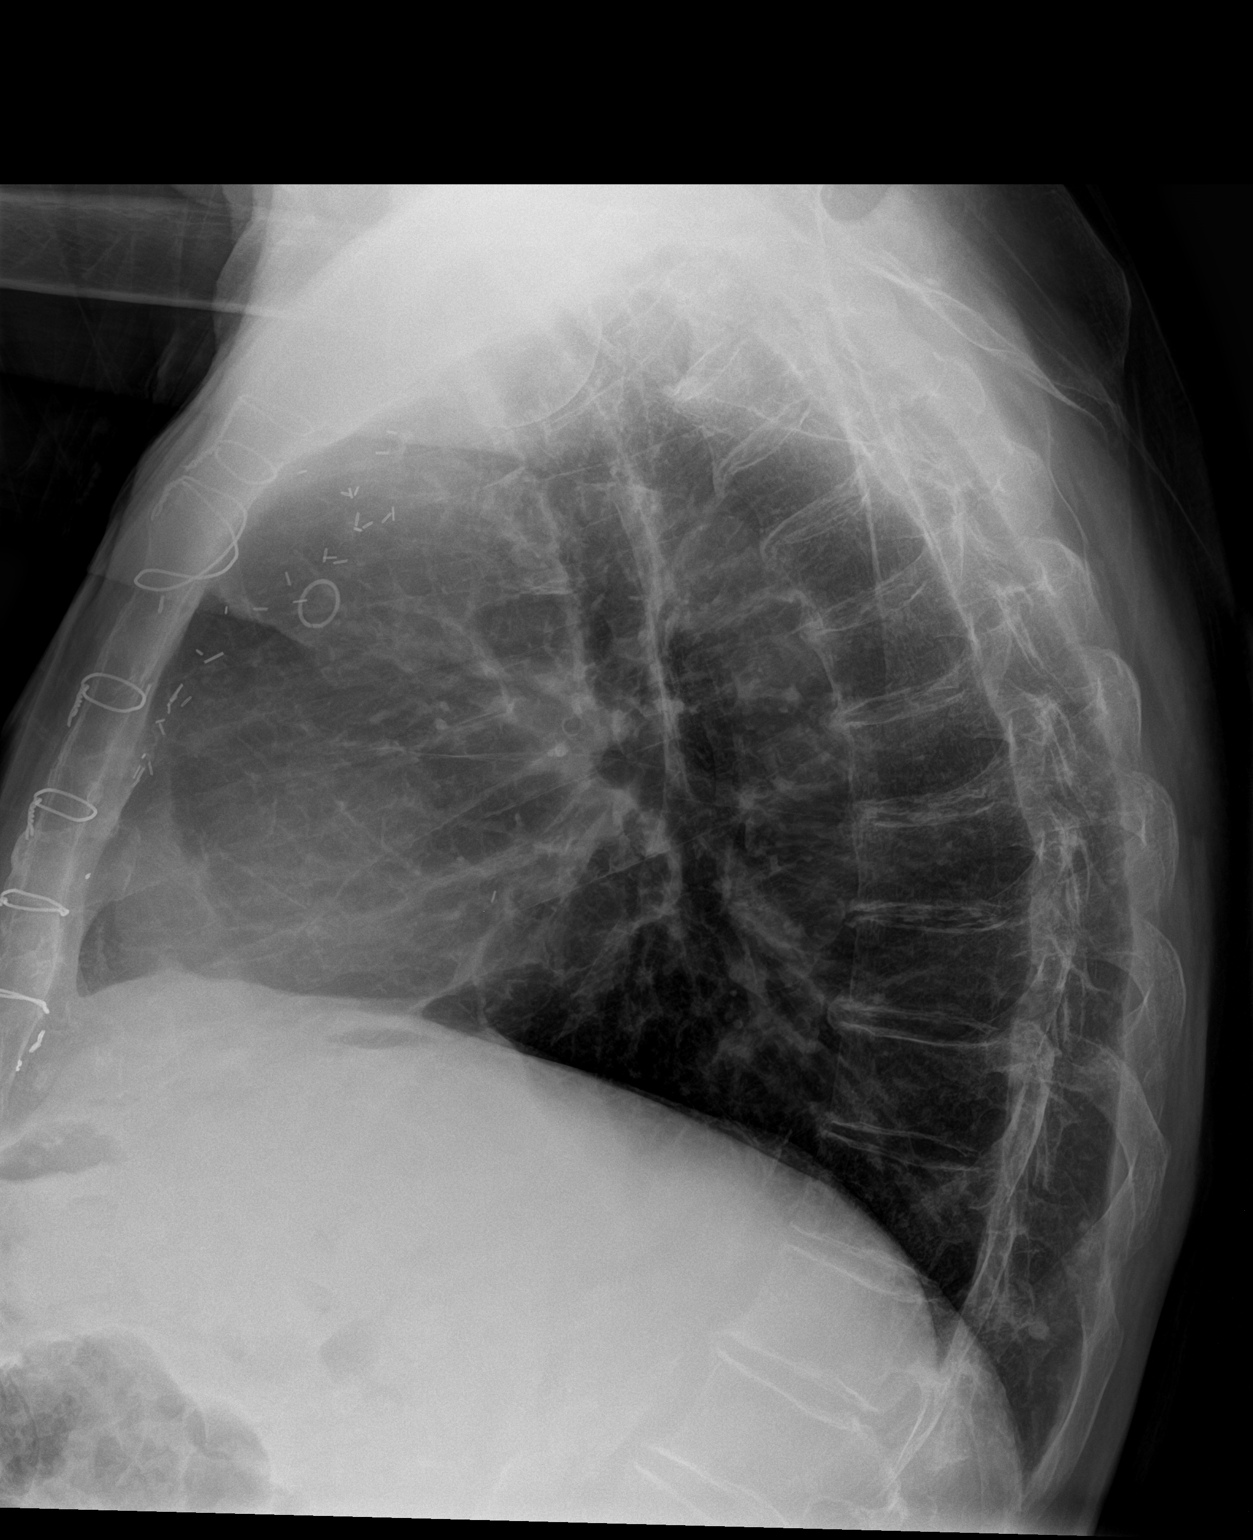

[2 of 2 positions shown; findings below may reference images not displayed]

FINDINGS: No significant progression in mild interstitial prominence. No new
consolidation. No pleural effusion. Stable cardiomediastinal
contours with evidence of cardiac surgery. No acute osseous
abnormality.
IMPRESSION: No evidence of progressive fibrotic disease.

## 2021-09-10 DIAGNOSIS — H43811 Vitreous degeneration, right eye: Secondary | ICD-10-CM | POA: Diagnosis not present

## 2021-09-10 DIAGNOSIS — H35372 Puckering of macula, left eye: Secondary | ICD-10-CM | POA: Diagnosis not present

## 2021-09-10 DIAGNOSIS — D3131 Benign neoplasm of right choroid: Secondary | ICD-10-CM | POA: Diagnosis not present

## 2021-09-10 DIAGNOSIS — H353231 Exudative age-related macular degeneration, bilateral, with active choroidal neovascularization: Secondary | ICD-10-CM | POA: Diagnosis not present

## 2021-10-10 DIAGNOSIS — E1165 Type 2 diabetes mellitus with hyperglycemia: Secondary | ICD-10-CM | POA: Diagnosis not present

## 2021-10-10 DIAGNOSIS — I10 Essential (primary) hypertension: Secondary | ICD-10-CM | POA: Diagnosis not present

## 2021-10-16 DIAGNOSIS — E1165 Type 2 diabetes mellitus with hyperglycemia: Secondary | ICD-10-CM | POA: Diagnosis not present

## 2021-10-16 DIAGNOSIS — Z8673 Personal history of transient ischemic attack (TIA), and cerebral infarction without residual deficits: Secondary | ICD-10-CM | POA: Diagnosis not present

## 2021-10-16 DIAGNOSIS — I10 Essential (primary) hypertension: Secondary | ICD-10-CM | POA: Diagnosis not present

## 2021-10-16 DIAGNOSIS — R269 Unspecified abnormalities of gait and mobility: Secondary | ICD-10-CM | POA: Diagnosis not present

## 2021-10-16 DIAGNOSIS — E782 Mixed hyperlipidemia: Secondary | ICD-10-CM | POA: Diagnosis not present

## 2021-10-16 DIAGNOSIS — I2510 Atherosclerotic heart disease of native coronary artery without angina pectoris: Secondary | ICD-10-CM | POA: Diagnosis not present

## 2021-10-16 DIAGNOSIS — Z0001 Encounter for general adult medical examination with abnormal findings: Secondary | ICD-10-CM | POA: Diagnosis not present

## 2021-10-16 DIAGNOSIS — H3530 Unspecified macular degeneration: Secondary | ICD-10-CM | POA: Diagnosis not present

## 2021-10-16 DIAGNOSIS — R809 Proteinuria, unspecified: Secondary | ICD-10-CM | POA: Diagnosis not present

## 2021-10-29 DIAGNOSIS — R059 Cough, unspecified: Secondary | ICD-10-CM | POA: Diagnosis not present

## 2021-10-29 DIAGNOSIS — I639 Cerebral infarction, unspecified: Secondary | ICD-10-CM | POA: Diagnosis not present

## 2021-10-29 DIAGNOSIS — E119 Type 2 diabetes mellitus without complications: Secondary | ICD-10-CM | POA: Diagnosis not present

## 2021-10-29 DIAGNOSIS — R1310 Dysphagia, unspecified: Secondary | ICD-10-CM | POA: Diagnosis not present

## 2021-10-29 DIAGNOSIS — Z7409 Other reduced mobility: Secondary | ICD-10-CM | POA: Diagnosis not present

## 2021-10-29 DIAGNOSIS — R42 Dizziness and giddiness: Secondary | ICD-10-CM | POA: Diagnosis not present

## 2021-10-29 DIAGNOSIS — Z951 Presence of aortocoronary bypass graft: Secondary | ICD-10-CM | POA: Diagnosis not present

## 2021-10-29 DIAGNOSIS — M6281 Muscle weakness (generalized): Secondary | ICD-10-CM | POA: Diagnosis not present

## 2021-10-29 DIAGNOSIS — R531 Weakness: Secondary | ICD-10-CM | POA: Diagnosis not present

## 2021-10-29 DIAGNOSIS — I6621 Occlusion and stenosis of right posterior cerebral artery: Secondary | ICD-10-CM | POA: Diagnosis not present

## 2021-10-29 DIAGNOSIS — R2681 Unsteadiness on feet: Secondary | ICD-10-CM | POA: Diagnosis not present

## 2021-10-29 DIAGNOSIS — R5383 Other fatigue: Secondary | ICD-10-CM | POA: Diagnosis not present

## 2021-10-29 DIAGNOSIS — I2510 Atherosclerotic heart disease of native coronary artery without angina pectoris: Secondary | ICD-10-CM | POA: Diagnosis not present

## 2021-10-29 DIAGNOSIS — G20 Parkinson's disease: Secondary | ICD-10-CM | POA: Diagnosis not present

## 2021-10-29 DIAGNOSIS — I10 Essential (primary) hypertension: Secondary | ICD-10-CM | POA: Diagnosis not present

## 2021-10-29 DIAGNOSIS — Z8673 Personal history of transient ischemic attack (TIA), and cerebral infarction without residual deficits: Secondary | ICD-10-CM | POA: Diagnosis not present

## 2021-10-29 DIAGNOSIS — I672 Cerebral atherosclerosis: Secondary | ICD-10-CM | POA: Diagnosis not present

## 2021-10-29 DIAGNOSIS — R2689 Other abnormalities of gait and mobility: Secondary | ICD-10-CM | POA: Diagnosis not present

## 2021-10-29 DIAGNOSIS — Z9181 History of falling: Secondary | ICD-10-CM | POA: Diagnosis not present

## 2021-10-29 DIAGNOSIS — Z79899 Other long term (current) drug therapy: Secondary | ICD-10-CM | POA: Diagnosis not present

## 2021-10-29 DIAGNOSIS — I6613 Occlusion and stenosis of bilateral anterior cerebral arteries: Secondary | ICD-10-CM | POA: Diagnosis not present

## 2021-10-29 DIAGNOSIS — Z7902 Long term (current) use of antithrombotics/antiplatelets: Secondary | ICD-10-CM | POA: Diagnosis not present

## 2021-10-29 DIAGNOSIS — I6350 Cerebral infarction due to unspecified occlusion or stenosis of unspecified cerebral artery: Secondary | ICD-10-CM | POA: Diagnosis not present

## 2021-10-29 DIAGNOSIS — Z7984 Long term (current) use of oral hypoglycemic drugs: Secondary | ICD-10-CM | POA: Diagnosis not present

## 2021-10-29 DIAGNOSIS — Z20822 Contact with and (suspected) exposure to covid-19: Secondary | ICD-10-CM | POA: Diagnosis not present

## 2021-10-29 DIAGNOSIS — H547 Unspecified visual loss: Secondary | ICD-10-CM | POA: Diagnosis not present

## 2021-10-30 DIAGNOSIS — J341 Cyst and mucocele of nose and nasal sinus: Secondary | ICD-10-CM | POA: Diagnosis not present

## 2021-10-30 DIAGNOSIS — E119 Type 2 diabetes mellitus without complications: Secondary | ICD-10-CM | POA: Diagnosis not present

## 2021-10-30 DIAGNOSIS — G20 Parkinson's disease: Secondary | ICD-10-CM | POA: Diagnosis not present

## 2021-10-30 DIAGNOSIS — Z79899 Other long term (current) drug therapy: Secondary | ICD-10-CM | POA: Diagnosis not present

## 2021-10-30 DIAGNOSIS — I614 Nontraumatic intracerebral hemorrhage in cerebellum: Secondary | ICD-10-CM | POA: Diagnosis not present

## 2021-10-30 DIAGNOSIS — R2689 Other abnormalities of gait and mobility: Secondary | ICD-10-CM | POA: Diagnosis not present

## 2021-10-30 DIAGNOSIS — I10 Essential (primary) hypertension: Secondary | ICD-10-CM | POA: Diagnosis not present

## 2021-10-30 DIAGNOSIS — Z951 Presence of aortocoronary bypass graft: Secondary | ICD-10-CM | POA: Diagnosis not present

## 2021-10-30 DIAGNOSIS — I2510 Atherosclerotic heart disease of native coronary artery without angina pectoris: Secondary | ICD-10-CM | POA: Diagnosis not present

## 2021-10-30 DIAGNOSIS — Z7902 Long term (current) use of antithrombotics/antiplatelets: Secondary | ICD-10-CM | POA: Diagnosis not present

## 2021-10-30 DIAGNOSIS — G9389 Other specified disorders of brain: Secondary | ICD-10-CM | POA: Diagnosis not present

## 2021-10-30 DIAGNOSIS — R29818 Other symptoms and signs involving the nervous system: Secondary | ICD-10-CM | POA: Diagnosis not present

## 2021-10-31 DIAGNOSIS — N200 Calculus of kidney: Secondary | ICD-10-CM | POA: Diagnosis not present

## 2021-10-31 DIAGNOSIS — M47892 Other spondylosis, cervical region: Secondary | ICD-10-CM | POA: Diagnosis not present

## 2021-10-31 DIAGNOSIS — I69393 Ataxia following cerebral infarction: Secondary | ICD-10-CM | POA: Diagnosis not present

## 2021-10-31 DIAGNOSIS — E119 Type 2 diabetes mellitus without complications: Secondary | ICD-10-CM | POA: Diagnosis not present

## 2021-10-31 DIAGNOSIS — I2510 Atherosclerotic heart disease of native coronary artery without angina pectoris: Secondary | ICD-10-CM | POA: Diagnosis not present

## 2021-10-31 DIAGNOSIS — E1165 Type 2 diabetes mellitus with hyperglycemia: Secondary | ICD-10-CM | POA: Diagnosis not present

## 2021-10-31 DIAGNOSIS — E782 Mixed hyperlipidemia: Secondary | ICD-10-CM | POA: Diagnosis not present

## 2021-10-31 DIAGNOSIS — Z7984 Long term (current) use of oral hypoglycemic drugs: Secondary | ICD-10-CM | POA: Diagnosis not present

## 2021-10-31 DIAGNOSIS — H3530 Unspecified macular degeneration: Secondary | ICD-10-CM | POA: Diagnosis not present

## 2021-10-31 DIAGNOSIS — I10 Essential (primary) hypertension: Secondary | ICD-10-CM | POA: Diagnosis not present

## 2021-10-31 DIAGNOSIS — G20 Parkinson's disease: Secondary | ICD-10-CM | POA: Diagnosis not present

## 2021-11-04 DIAGNOSIS — N200 Calculus of kidney: Secondary | ICD-10-CM | POA: Diagnosis not present

## 2021-11-04 DIAGNOSIS — I69393 Ataxia following cerebral infarction: Secondary | ICD-10-CM | POA: Diagnosis not present

## 2021-11-04 DIAGNOSIS — H3530 Unspecified macular degeneration: Secondary | ICD-10-CM | POA: Diagnosis not present

## 2021-11-04 DIAGNOSIS — G20 Parkinson's disease: Secondary | ICD-10-CM | POA: Diagnosis not present

## 2021-11-04 DIAGNOSIS — I2510 Atherosclerotic heart disease of native coronary artery without angina pectoris: Secondary | ICD-10-CM | POA: Diagnosis not present

## 2021-11-04 DIAGNOSIS — I10 Essential (primary) hypertension: Secondary | ICD-10-CM | POA: Diagnosis not present

## 2021-11-04 DIAGNOSIS — Z7984 Long term (current) use of oral hypoglycemic drugs: Secondary | ICD-10-CM | POA: Diagnosis not present

## 2021-11-04 DIAGNOSIS — E119 Type 2 diabetes mellitus without complications: Secondary | ICD-10-CM | POA: Diagnosis not present

## 2021-11-04 DIAGNOSIS — M47892 Other spondylosis, cervical region: Secondary | ICD-10-CM | POA: Diagnosis not present

## 2021-11-06 DIAGNOSIS — M6281 Muscle weakness (generalized): Secondary | ICD-10-CM | POA: Diagnosis not present

## 2021-11-06 DIAGNOSIS — G20 Parkinson's disease: Secondary | ICD-10-CM | POA: Diagnosis not present

## 2021-11-06 DIAGNOSIS — Z7901 Long term (current) use of anticoagulants: Secondary | ICD-10-CM | POA: Diagnosis not present

## 2021-11-06 DIAGNOSIS — Z8673 Personal history of transient ischemic attack (TIA), and cerebral infarction without residual deficits: Secondary | ICD-10-CM | POA: Diagnosis not present

## 2021-11-06 DIAGNOSIS — R319 Hematuria, unspecified: Secondary | ICD-10-CM | POA: Diagnosis not present

## 2021-11-11 DIAGNOSIS — I2510 Atherosclerotic heart disease of native coronary artery without angina pectoris: Secondary | ICD-10-CM | POA: Diagnosis not present

## 2021-11-11 DIAGNOSIS — N200 Calculus of kidney: Secondary | ICD-10-CM | POA: Diagnosis not present

## 2021-11-11 DIAGNOSIS — I69393 Ataxia following cerebral infarction: Secondary | ICD-10-CM | POA: Diagnosis not present

## 2021-11-11 DIAGNOSIS — G20 Parkinson's disease: Secondary | ICD-10-CM | POA: Diagnosis not present

## 2021-11-11 DIAGNOSIS — Z7984 Long term (current) use of oral hypoglycemic drugs: Secondary | ICD-10-CM | POA: Diagnosis not present

## 2021-11-11 DIAGNOSIS — I10 Essential (primary) hypertension: Secondary | ICD-10-CM | POA: Diagnosis not present

## 2021-11-11 DIAGNOSIS — H3530 Unspecified macular degeneration: Secondary | ICD-10-CM | POA: Diagnosis not present

## 2021-11-11 DIAGNOSIS — M47892 Other spondylosis, cervical region: Secondary | ICD-10-CM | POA: Diagnosis not present

## 2021-11-11 DIAGNOSIS — E119 Type 2 diabetes mellitus without complications: Secondary | ICD-10-CM | POA: Diagnosis not present

## 2021-11-11 NOTE — Progress Notes (Signed)
PATIENT: [name] DOB: [birth year]  REASON FOR VISIT: Follow up HISTORY FROM: Patient PRIMARY NEUROLOGIST: Dr. Krista Blue   HISTORY  [name] is a 81 year old male, seen in request by his primary care physician Dr. Allyn Kenner for evaluation of gait abnormality, he is accompanied by his wife at today's clinical visit on November 07, 2019.   I have reviewed and summarized the referring note from the referring physician.  He had a past medical history of hypertension, hyperlipidemia, diabetes, coronary artery disease,   He was previously very active, "very physical", enjoying his yard work in the summertime, around November 2020, he noticed gradual onset gait abnormality, fell few times, acute worsening since he fell at the end of December 2020, he fell in the bathtub on his back, following that fall, he had 2 weeks history of urinary incontinence, now gradually improved, but his gait abnormality remains, he was noted to have small stride, careful, stiff gait,   He denied loss of sense of smell, denies REM sleep disorder or memory loss, denies hand tremor, denies bilateral upper or lower extremity paresthesia, denies significant pain, he denies double vision, swallowing difficulty, ptosis,   Laboratory evaluations in January 2021, normal CBC hemoglobin of 13.9, CMP, creatinine of 0.94, LDL was 68, A1c was 7.0,   UPDATE December 21 2019: He continue complains of unsteady gait, seems to have more trouble on his right side   MRI of brain in March 2021, small subacute lacunar infarction in the right cerebellum hemisphere,also chronic microvascular ischemic changes in the right middle cerebellar peduncle, left pons, subcortical, periventricular, deep white matter's,   MRI of cervical spine showed multilevel degenerative changes, but there was no evidence of spinal cord or canal stenosis.   Laboratory evaluation February 2021, normal L49, RPR, folic acid, C-reactive protein, TSH, CPK, ESR, ANA,  acetylcholine receptor antibody   UPDATE April 23 2020: Snienemt has helped his walking, reported mild to moderate improvement, taking it at 9 AM, 1 PM, 6 PM, noticed elevated 30 minutes after medication, also noticed wearing off before the next dose, he is not ambulated with a cane, physical therapy was helpful as well   He denied loss sense of smell, no REM sleep disorder, no significant side effect from Sinemet dose to try higher dose   Echocardiogram showed ejection fraction 50 to 55%, no right-to-left shunt by bubble study Ultrasound of carotid artery showed less than 39% stenosis bilaterally, is taking Plavix 75 mg only   UPDATE May 12 2021: He fell in tub, had incontinence, quit driving in Feb 1791.  Now almost back to his baseline, but overall slow decline, He took sinemet 9am, 2pm, 6pm,  it does help him moves better, he noticed wearing off, also noticed worsening memory loss   Ultrasound of carotid artery May 2021 less than 39% stenosis bilaterally, antegrade flow bilateral vertebral artery   Echocardiogram in May 2021, no evidence of intracardiac shunting  Update November 12, 2021 SS: In [facility] for gait abnormality, CT scan showed bilateral basal ganglia chronic lacunar infarcts, 10 mm hypodense focus right pons could be infarct or artifact? MRI was negative for acute infarct, did show atrophy moderate SVD. Discharged to continue Plavix, statin. Since 2/8, feels weak generally, feels more shuffling with gait, trouble getting out of chair. Just started PT/OT this week at home. Is taking Sinemet 25/100, 2 in AM, 2 midday, 2 evening, 1.5 at bedtime. Azilect 1 mg daily. MMSE 28/30. 50% of  his self since event. No falls. This reminds his wife of when he had his first stroke (called his wife in to the bedroom, felt couldn't move, dizzy)-same presentation this time. MMSE 28/30.  Labs from ER visit, showed urine with moderate Blood but no infection, CBC CMP showed no  significant abnormality.   CTA head:   1. No intracranial large vessel occlusion is identified.  2. Intracranial atherosclerotic disease with multifocal stenoses,  most notably as follows.  3. Severe stenosis within the A3 right anterior cerebral artery.  4. Moderate stenosis within the A3 left anterior cerebral artery.  5. Severe stenosis within the left anterior cerebral artery at the  A3/A4 junction.  6. Severe stenosis within the right PCA at the P2/P3 junction.   REVIEW OF SYSTEMS: Out of a complete 14 system review of symptoms, the patient complains only of the following symptoms, and all other reviewed systems are negative.  See HPI  ALLERGIES: No Known Allergies  HOME MEDICATIONS: Outpatient Medications Prior to Visit  Medication Sig Dispense Refill   amLODipine-benazepril (LOTREL) 5-20 MG capsule Take 1 capsule by mouth daily.     carbidopa-levodopa (SINEMET IR) 25-100 MG tablet Take 2 tablets by mouth 4 (four) times daily. 720 tablet 3   cetirizine (ZYRTEC) 10 MG tablet Take 10 mg by mouth daily as needed for allergies.     clopidogrel (PLAVIX) 75 MG tablet TAKE 1 TABLET BY MOUTH EVERY DAY 90 tablet 4   metFORMIN (GLUCOPHAGE) 1000 MG tablet Take 1,000 mg by mouth 2 (two) times daily with a meal.  3   Multiple Vitamins-Minerals (PRESERVISION AREDS 2+MULTI VIT) CAPS Take by mouth.     rasagiline (AZILECT) 1 MG TABS tablet Take 1 tablet (1 mg total) by mouth daily. 30 tablet 0   simvastatin (ZOCOR) 10 MG tablet Take 10 mg by mouth daily.     nitroGLYCERIN (NITROSTAT) 0.4 MG SL tablet Place 1 tablet (0.4 mg total) under the tongue every 5 (five) minutes as needed for chest pain. 25 tablet 3   No facility-administered medications prior to visit.    PAST MEDICAL HISTORY: Past Medical History:  Diagnosis Date   Abnormal gait    Fatigue    Hypertension    Prostate cancer ([place])    Tremor    Vitamin D deficiency     PAST SURGICAL HISTORY: Past Surgical History:   Procedure Laterality Date   CARDIAC SURGERY     bypass   COLONOSCOPY N/A 02/09/2017   Procedure: COLONOSCOPY;  Surgeon: Aviva Signs, MD;  Location: [facility];  Service: Gastroenterology;  Laterality: N/A;   HEMORROIDECTOMY     KNEE SURGERY Right     FAMILY HISTORY: Family History  Problem Relation Age of Onset   Dementia Mother    Diabetes Father    Lung cancer Father    Breast cancer Sister    Multiple myeloma Brother     SOCIAL HISTORY: Social History   Socioeconomic History   Marital status: Married    Spouse name: Not on file   Number of children: 1   Years of education: 12   Highest education level: High school graduate  Occupational History   Occupation: Retired  Tobacco Use   Smoking status: Former    Packs/day: 1.00    Years: 9.00    Pack years: 9.00    Types: Cigarettes    Quit date: 02/25/1971    Years since quitting: 50.7   Smokeless tobacco: Former    Types:  Snuff  Vaping Use   Vaping Use: Never used  Substance and Sexual Activity   Alcohol use: No    Alcohol/week: 0.0 standard drinks   Drug use: No   Sexual activity: Not on file  Other Topics Concern   Not on file  Social History Narrative   Lives at home with his wife.   Right-handed.   2 cups caffeine per day.   Social Determinants of Health   Financial Resource Strain: Not on file  Food Insecurity: Not on file  Transportation Needs: Not on file  Physical Activity: Not on file  Stress: Not on file  Social Connections: Not on file  Intimate Partner Violence: Not on file   PHYSICAL EXAM  Vitals:   11/12/21 1308  BP: 137/75  Pulse: 89  Weight: 176 lb (79.8 kg)  Height: _0  (1.753 m)   Body mass index is 25.99 kg/m.  Generalized: Well developed, in no acute distress  MMSE - Mini Mental State Exam 11/12/2021  Orientation to time 4  Orientation to Place 5  Registration 3  Attention/ Calculation 5  Recall 2  Language- name 2 objects 2  Language- repeat 1  Language-  follow 3 step command 3  Language- read & follow direction 1  Write a sentence 1  Copy design 1  Copy design-comments 4 legged animals x 1 min: 7  Total score 28    Neurological examination  Mentation: Alert oriented to time, place, history taking. Follows all commands speech and language fluent Cranial nerve II-XII: Pupils were equal round reactive to light. Extraocular movements were full, visual field were full on confrontational test. Facial sensation and strength were normal.  Head turning and shoulder shrug  were normal and symmetric. Motor: The motor testing reveals 5 over 5 strength of all 4 extremities. Good symmetric motor tone is noted throughout. Mild bradykinesia, right > left  Sensory: Sensory testing is intact to soft touch on all 4 extremities. No evidence of extinction is noted.  Coordination: Cerebellar testing reveals good finger-nose-finger and heel-to-shin bilaterally.  Gait and station:  push off from seated position, with walker gait is steady, good stride, pace, forward leaning Reflexes: Deep tendon reflexes are symmetric  DIAGNOSTIC DATA (LABS, IMAGING, TESTING) - I reviewed patient records, labs, notes, testing and imaging myself where available.  Lab Results  Component Value Date   WBC 4.7 12/25/2010   HGB 13.8 12/25/2010   HCT 40.5 12/25/2010   MCV 91.8 12/25/2010   PLT 232 12/25/2010      Component Value Date/Time   NA 138 12/25/2010 1347   K 3.9 12/25/2010 1347   CL 104 12/25/2010 1347   CO2 27 12/25/2010 1347   GLUCOSE 174 (H) 12/25/2010 1347   BUN 13 12/25/2010 1347   CREATININE 0.80 11/30/2019 1311   CALCIUM 9.5 12/25/2010 1347   PROT 6.8 04/01/2010 0900   ALBUMIN 3.9 04/01/2010 0900   AST 21 04/01/2010 0900   ALT 21 04/01/2010 0900   ALKPHOS 72 04/01/2010 0900   BILITOT 0.8 04/01/2010 0900   GFRNONAA >60 12/25/2010 1347   GFRAA  12/25/2010 1347    >60        The eGFR has been calculated using the MDRD equation. This calculation has  not been validated in all clinical situations. eGFR's persistently <60 mL/min signify possible Chronic Kidney Disease.   No results found for: CHOL, HDL, LDLCALC, LDLDIRECT, TRIG, CHOLHDL No results found for: HGBA1C Lab Results  Component Value Date  IRJJOACZ66 284 11/07/2019   Lab Results  Component Value Date   TSH 1.390 11/07/2019   ASSESSMENT AND PLAN 81 y.o. year old male   1.  Parkinson's disease 2.  History of stroke 3.  Recent stroke like event 10/29/2021, gait abnormality, general weakness when waking  -Will get MRI disc for Dr. Krista Blue to review, some question of new stroke on CT head, but MRI was negative for acute event, there is chronic infarcts in the cerebellum, chronic microhemorrhage in left cerebellum and right thalamus, MRI showed atrophy and moderate chronic microvascular ischemic change -MMSE 28/30 today, unclear etiology of acute gait change/weakness, seems to be doing fairly well today on his walker -Continue Sinemet 25/100 mg 2 tablets 4 times daily -Just started PT/OT at home, continue this -Continue Plavix for secondary stroke prevention -Continue follow-up with PCP for management of vascular risk factors -Return back early in 4 months with Dr. Marilynn Latino, AGNP-C, DNP 11/12/2021, 1:43 PM [facility] [address], [place], [place] [address] [phone]

## 2021-11-12 ENCOUNTER — Ambulatory Visit: Payer: Medicare Other | Admitting: Neurology

## 2021-11-12 ENCOUNTER — Telehealth: Payer: Self-pay | Admitting: Neurology

## 2021-11-12 ENCOUNTER — Encounter: Payer: Self-pay | Admitting: Neurology

## 2021-11-12 VITALS — BP 137/75 | HR 89 | Ht 69.0 in | Wt 176.0 lb

## 2021-11-12 DIAGNOSIS — I639 Cerebral infarction, unspecified: Secondary | ICD-10-CM

## 2021-11-12 DIAGNOSIS — G20 Parkinson's disease: Secondary | ICD-10-CM | POA: Diagnosis not present

## 2021-11-12 NOTE — Telephone Encounter (Signed)
Hilda Blades, can you please get the MRI disc from recent ER visit at [facility] visit 10/29/21 for MRI of the brain for Dr. Krista Blue to review? Thanks!

## 2021-11-13 NOTE — Progress Notes (Signed)
Chart reviewed, agree above plan ?

## 2021-11-13 NOTE — Telephone Encounter (Signed)
Request made today to [facility].

## 2021-11-17 NOTE — Telephone Encounter (Signed)
[facility] cd in Nurse pod.

## 2021-11-20 DIAGNOSIS — N200 Calculus of kidney: Secondary | ICD-10-CM | POA: Diagnosis not present

## 2021-11-20 DIAGNOSIS — I2510 Atherosclerotic heart disease of native coronary artery without angina pectoris: Secondary | ICD-10-CM | POA: Diagnosis not present

## 2021-11-20 DIAGNOSIS — I10 Essential (primary) hypertension: Secondary | ICD-10-CM | POA: Diagnosis not present

## 2021-11-20 DIAGNOSIS — E119 Type 2 diabetes mellitus without complications: Secondary | ICD-10-CM | POA: Diagnosis not present

## 2021-11-20 DIAGNOSIS — M47892 Other spondylosis, cervical region: Secondary | ICD-10-CM | POA: Diagnosis not present

## 2021-11-20 DIAGNOSIS — G20 Parkinson's disease: Secondary | ICD-10-CM | POA: Diagnosis not present

## 2021-11-20 DIAGNOSIS — H3530 Unspecified macular degeneration: Secondary | ICD-10-CM | POA: Diagnosis not present

## 2021-11-20 DIAGNOSIS — I69393 Ataxia following cerebral infarction: Secondary | ICD-10-CM | POA: Diagnosis not present

## 2021-11-20 DIAGNOSIS — Z7984 Long term (current) use of oral hypoglycemic drugs: Secondary | ICD-10-CM | POA: Diagnosis not present

## 2021-11-21 DIAGNOSIS — E119 Type 2 diabetes mellitus without complications: Secondary | ICD-10-CM | POA: Diagnosis not present

## 2021-11-21 DIAGNOSIS — Z7984 Long term (current) use of oral hypoglycemic drugs: Secondary | ICD-10-CM | POA: Diagnosis not present

## 2021-11-21 DIAGNOSIS — I69393 Ataxia following cerebral infarction: Secondary | ICD-10-CM | POA: Diagnosis not present

## 2021-11-21 DIAGNOSIS — G20 Parkinson's disease: Secondary | ICD-10-CM | POA: Diagnosis not present

## 2021-11-21 DIAGNOSIS — N200 Calculus of kidney: Secondary | ICD-10-CM | POA: Diagnosis not present

## 2021-11-21 DIAGNOSIS — I2510 Atherosclerotic heart disease of native coronary artery without angina pectoris: Secondary | ICD-10-CM | POA: Diagnosis not present

## 2021-11-21 DIAGNOSIS — M47892 Other spondylosis, cervical region: Secondary | ICD-10-CM | POA: Diagnosis not present

## 2021-11-21 DIAGNOSIS — I10 Essential (primary) hypertension: Secondary | ICD-10-CM | POA: Diagnosis not present

## 2021-11-21 DIAGNOSIS — H3530 Unspecified macular degeneration: Secondary | ICD-10-CM | POA: Diagnosis not present

## 2021-11-24 DIAGNOSIS — E119 Type 2 diabetes mellitus without complications: Secondary | ICD-10-CM | POA: Diagnosis not present

## 2021-11-24 DIAGNOSIS — H353231 Exudative age-related macular degeneration, bilateral, with active choroidal neovascularization: Secondary | ICD-10-CM | POA: Diagnosis not present

## 2021-11-24 DIAGNOSIS — H35372 Puckering of macula, left eye: Secondary | ICD-10-CM | POA: Diagnosis not present

## 2021-11-24 DIAGNOSIS — H43811 Vitreous degeneration, right eye: Secondary | ICD-10-CM | POA: Diagnosis not present

## 2021-11-24 DIAGNOSIS — D3131 Benign neoplasm of right choroid: Secondary | ICD-10-CM | POA: Diagnosis not present

## 2021-11-25 DIAGNOSIS — Z7984 Long term (current) use of oral hypoglycemic drugs: Secondary | ICD-10-CM | POA: Diagnosis not present

## 2021-11-25 DIAGNOSIS — I10 Essential (primary) hypertension: Secondary | ICD-10-CM | POA: Diagnosis not present

## 2021-11-25 DIAGNOSIS — M47892 Other spondylosis, cervical region: Secondary | ICD-10-CM | POA: Diagnosis not present

## 2021-11-25 DIAGNOSIS — G20 Parkinson's disease: Secondary | ICD-10-CM | POA: Diagnosis not present

## 2021-11-25 DIAGNOSIS — H3530 Unspecified macular degeneration: Secondary | ICD-10-CM | POA: Diagnosis not present

## 2021-11-25 DIAGNOSIS — I2510 Atherosclerotic heart disease of native coronary artery without angina pectoris: Secondary | ICD-10-CM | POA: Diagnosis not present

## 2021-11-25 DIAGNOSIS — I69393 Ataxia following cerebral infarction: Secondary | ICD-10-CM | POA: Diagnosis not present

## 2021-11-25 DIAGNOSIS — E119 Type 2 diabetes mellitus without complications: Secondary | ICD-10-CM | POA: Diagnosis not present

## 2021-11-25 DIAGNOSIS — N200 Calculus of kidney: Secondary | ICD-10-CM | POA: Diagnosis not present

## 2021-11-26 ENCOUNTER — Telehealth: Payer: Self-pay | Admitting: Neurology

## 2021-11-26 NOTE — Telephone Encounter (Signed)
I did personally reviewed MRI of the brain without contrast from [facility] health on October 30, 2021: ? ?No acute abnormality, atrophy and moderate chronic small vessel disease ? ?I was able to compare to previous study in March 2021, there was no significant change. ?

## 2021-11-26 NOTE — Telephone Encounter (Signed)
I called the patient's wife and let her know MRI results, no new strokes. She was very appreciative of Dr. Krista Blue reviewing.  ?

## 2021-12-01 DIAGNOSIS — G20 Parkinson's disease: Secondary | ICD-10-CM | POA: Diagnosis not present

## 2021-12-01 DIAGNOSIS — E119 Type 2 diabetes mellitus without complications: Secondary | ICD-10-CM | POA: Diagnosis not present

## 2021-12-01 DIAGNOSIS — N200 Calculus of kidney: Secondary | ICD-10-CM | POA: Diagnosis not present

## 2021-12-01 DIAGNOSIS — I10 Essential (primary) hypertension: Secondary | ICD-10-CM | POA: Diagnosis not present

## 2021-12-01 DIAGNOSIS — Z7984 Long term (current) use of oral hypoglycemic drugs: Secondary | ICD-10-CM | POA: Diagnosis not present

## 2021-12-01 DIAGNOSIS — M47892 Other spondylosis, cervical region: Secondary | ICD-10-CM | POA: Diagnosis not present

## 2021-12-01 DIAGNOSIS — I69393 Ataxia following cerebral infarction: Secondary | ICD-10-CM | POA: Diagnosis not present

## 2021-12-01 DIAGNOSIS — I2510 Atherosclerotic heart disease of native coronary artery without angina pectoris: Secondary | ICD-10-CM | POA: Diagnosis not present

## 2021-12-01 DIAGNOSIS — H3530 Unspecified macular degeneration: Secondary | ICD-10-CM | POA: Diagnosis not present

## 2021-12-05 DIAGNOSIS — I2510 Atherosclerotic heart disease of native coronary artery without angina pectoris: Secondary | ICD-10-CM | POA: Diagnosis not present

## 2021-12-05 DIAGNOSIS — E119 Type 2 diabetes mellitus without complications: Secondary | ICD-10-CM | POA: Diagnosis not present

## 2021-12-05 DIAGNOSIS — Z7984 Long term (current) use of oral hypoglycemic drugs: Secondary | ICD-10-CM | POA: Diagnosis not present

## 2021-12-05 DIAGNOSIS — M47892 Other spondylosis, cervical region: Secondary | ICD-10-CM | POA: Diagnosis not present

## 2021-12-05 DIAGNOSIS — G20 Parkinson's disease: Secondary | ICD-10-CM | POA: Diagnosis not present

## 2021-12-05 DIAGNOSIS — I69393 Ataxia following cerebral infarction: Secondary | ICD-10-CM | POA: Diagnosis not present

## 2021-12-05 DIAGNOSIS — N200 Calculus of kidney: Secondary | ICD-10-CM | POA: Diagnosis not present

## 2021-12-05 DIAGNOSIS — I10 Essential (primary) hypertension: Secondary | ICD-10-CM | POA: Diagnosis not present

## 2021-12-05 DIAGNOSIS — H3530 Unspecified macular degeneration: Secondary | ICD-10-CM | POA: Diagnosis not present

## 2021-12-08 DIAGNOSIS — I10 Essential (primary) hypertension: Secondary | ICD-10-CM | POA: Diagnosis not present

## 2021-12-08 DIAGNOSIS — E119 Type 2 diabetes mellitus without complications: Secondary | ICD-10-CM | POA: Diagnosis not present

## 2021-12-08 DIAGNOSIS — M47892 Other spondylosis, cervical region: Secondary | ICD-10-CM | POA: Diagnosis not present

## 2021-12-08 DIAGNOSIS — H3530 Unspecified macular degeneration: Secondary | ICD-10-CM | POA: Diagnosis not present

## 2021-12-08 DIAGNOSIS — G20 Parkinson's disease: Secondary | ICD-10-CM | POA: Diagnosis not present

## 2021-12-08 DIAGNOSIS — N200 Calculus of kidney: Secondary | ICD-10-CM | POA: Diagnosis not present

## 2021-12-08 DIAGNOSIS — Z7984 Long term (current) use of oral hypoglycemic drugs: Secondary | ICD-10-CM | POA: Diagnosis not present

## 2021-12-08 DIAGNOSIS — I69393 Ataxia following cerebral infarction: Secondary | ICD-10-CM | POA: Diagnosis not present

## 2021-12-08 DIAGNOSIS — I2510 Atherosclerotic heart disease of native coronary artery without angina pectoris: Secondary | ICD-10-CM | POA: Diagnosis not present

## 2021-12-15 DIAGNOSIS — I10 Essential (primary) hypertension: Secondary | ICD-10-CM | POA: Diagnosis not present

## 2021-12-15 DIAGNOSIS — H3530 Unspecified macular degeneration: Secondary | ICD-10-CM | POA: Diagnosis not present

## 2021-12-15 DIAGNOSIS — E119 Type 2 diabetes mellitus without complications: Secondary | ICD-10-CM | POA: Diagnosis not present

## 2021-12-15 DIAGNOSIS — I69393 Ataxia following cerebral infarction: Secondary | ICD-10-CM | POA: Diagnosis not present

## 2021-12-15 DIAGNOSIS — Z7984 Long term (current) use of oral hypoglycemic drugs: Secondary | ICD-10-CM | POA: Diagnosis not present

## 2021-12-15 DIAGNOSIS — G20 Parkinson's disease: Secondary | ICD-10-CM | POA: Diagnosis not present

## 2021-12-15 DIAGNOSIS — N200 Calculus of kidney: Secondary | ICD-10-CM | POA: Diagnosis not present

## 2021-12-15 DIAGNOSIS — M47892 Other spondylosis, cervical region: Secondary | ICD-10-CM | POA: Diagnosis not present

## 2021-12-15 DIAGNOSIS — I2510 Atherosclerotic heart disease of native coronary artery without angina pectoris: Secondary | ICD-10-CM | POA: Diagnosis not present

## 2021-12-29 DIAGNOSIS — G20 Parkinson's disease: Secondary | ICD-10-CM | POA: Diagnosis not present

## 2021-12-29 DIAGNOSIS — M47892 Other spondylosis, cervical region: Secondary | ICD-10-CM | POA: Diagnosis not present

## 2021-12-29 DIAGNOSIS — I69393 Ataxia following cerebral infarction: Secondary | ICD-10-CM | POA: Diagnosis not present

## 2021-12-29 DIAGNOSIS — H3530 Unspecified macular degeneration: Secondary | ICD-10-CM | POA: Diagnosis not present

## 2021-12-29 DIAGNOSIS — Z7984 Long term (current) use of oral hypoglycemic drugs: Secondary | ICD-10-CM | POA: Diagnosis not present

## 2021-12-29 DIAGNOSIS — I10 Essential (primary) hypertension: Secondary | ICD-10-CM | POA: Diagnosis not present

## 2021-12-29 DIAGNOSIS — N200 Calculus of kidney: Secondary | ICD-10-CM | POA: Diagnosis not present

## 2021-12-29 DIAGNOSIS — E119 Type 2 diabetes mellitus without complications: Secondary | ICD-10-CM | POA: Diagnosis not present

## 2021-12-29 DIAGNOSIS — I2510 Atherosclerotic heart disease of native coronary artery without angina pectoris: Secondary | ICD-10-CM | POA: Diagnosis not present

## 2022-01-02 DIAGNOSIS — D3131 Benign neoplasm of right choroid: Secondary | ICD-10-CM | POA: Diagnosis not present

## 2022-01-02 DIAGNOSIS — H353231 Exudative age-related macular degeneration, bilateral, with active choroidal neovascularization: Secondary | ICD-10-CM | POA: Diagnosis not present

## 2022-01-02 DIAGNOSIS — E119 Type 2 diabetes mellitus without complications: Secondary | ICD-10-CM | POA: Diagnosis not present

## 2022-01-02 DIAGNOSIS — H43813 Vitreous degeneration, bilateral: Secondary | ICD-10-CM | POA: Diagnosis not present

## 2022-01-07 ENCOUNTER — Telehealth: Payer: Self-pay | Admitting: Cardiology

## 2022-01-07 NOTE — Telephone Encounter (Signed)
Patient would like to switch provider from Dr. Percival Spanish to Dr. Domenic Polite at the [facility] office.  The [place] office is closer to him. Are you both in agreement with this?  ?

## 2022-02-05 ENCOUNTER — Encounter: Payer: Self-pay | Admitting: Neurology

## 2022-02-10 DIAGNOSIS — H31011 Macula scars of posterior pole (postinflammatory) (post-traumatic), right eye: Secondary | ICD-10-CM | POA: Diagnosis not present

## 2022-02-10 DIAGNOSIS — H353231 Exudative age-related macular degeneration, bilateral, with active choroidal neovascularization: Secondary | ICD-10-CM | POA: Diagnosis not present

## 2022-02-10 DIAGNOSIS — H3581 Retinal edema: Secondary | ICD-10-CM | POA: Diagnosis not present

## 2022-02-10 DIAGNOSIS — H43813 Vitreous degeneration, bilateral: Secondary | ICD-10-CM | POA: Diagnosis not present

## 2022-03-16 ENCOUNTER — Ambulatory Visit: Payer: Medicare Other | Admitting: Neurology

## 2022-03-27 DIAGNOSIS — H353231 Exudative age-related macular degeneration, bilateral, with active choroidal neovascularization: Secondary | ICD-10-CM | POA: Diagnosis not present

## 2022-03-27 DIAGNOSIS — H26492 Other secondary cataract, left eye: Secondary | ICD-10-CM | POA: Diagnosis not present

## 2022-03-27 DIAGNOSIS — H43813 Vitreous degeneration, bilateral: Secondary | ICD-10-CM | POA: Diagnosis not present

## 2022-03-27 DIAGNOSIS — H31011 Macula scars of posterior pole (postinflammatory) (post-traumatic), right eye: Secondary | ICD-10-CM | POA: Diagnosis not present

## 2022-04-09 DIAGNOSIS — Z0000 Encounter for general adult medical examination without abnormal findings: Secondary | ICD-10-CM | POA: Diagnosis not present

## 2022-04-09 DIAGNOSIS — Z136 Encounter for screening for cardiovascular disorders: Secondary | ICD-10-CM | POA: Diagnosis not present

## 2022-04-09 DIAGNOSIS — R7301 Impaired fasting glucose: Secondary | ICD-10-CM | POA: Diagnosis not present

## 2022-04-09 DIAGNOSIS — Z5181 Encounter for therapeutic drug level monitoring: Secondary | ICD-10-CM | POA: Diagnosis not present

## 2022-04-15 DIAGNOSIS — H3530 Unspecified macular degeneration: Secondary | ICD-10-CM | POA: Diagnosis not present

## 2022-04-15 DIAGNOSIS — I2510 Atherosclerotic heart disease of native coronary artery without angina pectoris: Secondary | ICD-10-CM | POA: Diagnosis not present

## 2022-04-15 DIAGNOSIS — Z0000 Encounter for general adult medical examination without abnormal findings: Secondary | ICD-10-CM | POA: Diagnosis not present

## 2022-04-15 DIAGNOSIS — E1165 Type 2 diabetes mellitus with hyperglycemia: Secondary | ICD-10-CM | POA: Diagnosis not present

## 2022-04-15 DIAGNOSIS — R269 Unspecified abnormalities of gait and mobility: Secondary | ICD-10-CM | POA: Diagnosis not present

## 2022-04-15 DIAGNOSIS — E782 Mixed hyperlipidemia: Secondary | ICD-10-CM | POA: Diagnosis not present

## 2022-04-15 DIAGNOSIS — I10 Essential (primary) hypertension: Secondary | ICD-10-CM | POA: Diagnosis not present

## 2022-04-15 DIAGNOSIS — Z8673 Personal history of transient ischemic attack (TIA), and cerebral infarction without residual deficits: Secondary | ICD-10-CM | POA: Diagnosis not present

## 2022-04-15 DIAGNOSIS — N3281 Overactive bladder: Secondary | ICD-10-CM | POA: Diagnosis not present

## 2022-05-04 ENCOUNTER — Ambulatory Visit: Payer: Medicare Other | Admitting: Neurology

## 2022-05-04 ENCOUNTER — Encounter: Payer: Self-pay | Admitting: Neurology

## 2022-05-04 VITALS — BP 132/73 | HR 86 | Ht 69.0 in | Wt 181.0 lb

## 2022-05-04 DIAGNOSIS — G20 Parkinson's disease: Secondary | ICD-10-CM

## 2022-05-04 DIAGNOSIS — I639 Cerebral infarction, unspecified: Secondary | ICD-10-CM

## 2022-05-04 DIAGNOSIS — R269 Unspecified abnormalities of gait and mobility: Secondary | ICD-10-CM | POA: Diagnosis not present

## 2022-05-04 MED ORDER — CLOPIDOGREL BISULFATE 75 MG PO TABS: 75.0000 mg | ORAL_TABLET | Freq: Every day | ORAL | 4 refills | Status: DC

## 2022-05-04 MED ORDER — CARBIDOPA-LEVODOPA 25-100 MG PO TABS: 2.0000 | ORAL_TABLET | Freq: Three times a day (TID) | ORAL | 3 refills | Status: DC

## 2022-05-04 NOTE — Progress Notes (Signed)
PRIMARY NEUROLOGIST:  [name] is a 81 year old male, seen in request by his primary care physician Dr. Allyn Kenner for evaluation of gait abnormality, he is accompanied by his wife at today's clinical visit on November 07, 2019.   I have reviewed and summarized the referring note from the referring physician.  He had a past medical history of hypertension, hyperlipidemia, diabetes, coronary artery disease,   He was previously very active, "very physical", enjoying his yard work in the summertime, around November 2020, he noticed gradual onset gait abnormality, fell few times, acute worsening since he fell at the end of December 2020, he fell in the bathtub on his back, following that fall, he had 2 weeks history of urinary incontinence, now gradually improved, but his gait abnormality remains, he was noted to have small stride, careful, stiff gait,   He denied loss of sense of smell, denies REM sleep disorder or memory loss, denies hand tremor, denies bilateral upper or lower extremity paresthesia, denies significant pain, he denies double vision, swallowing difficulty, ptosis,   Laboratory evaluations in January 2021, normal CBC hemoglobin of 13.9, CMP, creatinine of 0.94, LDL was 68, A1c was 7.0,   UPDATE December 21 2019: He continue complains of unsteady gait, seems to have more trouble on his right side   MRI of brain in March 2021, small subacute lacunar infarction in the right cerebellum hemisphere,also chronic microvascular ischemic changes in the right middle cerebellar peduncle, left pons, subcortical, periventricular, deep white matter's,   MRI of cervical spine showed multilevel degenerative changes, but there was no evidence of spinal cord or canal stenosis.   Laboratory evaluation February 2021, normal W09, RPR, folic acid, C-reactive protein, TSH, CPK, ESR, ANA, acetylcholine receptor antibody   UPDATE April 23 2020: Snienemt has helped his walking, reported mild to  moderate improvement, taking it at 9 AM, 1 PM, 6 PM, noticed elevated 30 minutes after medication, also noticed wearing off before the next dose, he is not ambulated with a cane, physical therapy was helpful as well   He denied loss sense of smell, no REM sleep disorder, no significant side effect from Sinemet dose to try higher dose   Echocardiogram showed ejection fraction 50 to 55%, no right-to-left shunt by bubble study Ultrasound of carotid artery showed less than 39% stenosis bilaterally, is taking Plavix 75 mg only   UPDATE May 12 2021: He fell in tub, had incontinence, quit driving in Feb 8119.  Now almost back to his baseline, but overall slow decline, He took sinemet 9am, 2pm, 6pm,  it does help him moves better, he noticed wearing off, also noticed worsening memory loss   Ultrasound of carotid artery May 2021 less than 39% stenosis bilaterally, antegrade flow bilateral vertebral artery   Echocardiogram in May 2021, no evidence of intracardiac shunting  Update November 12, 2021 SS: In ER [facility] 10/29/21 for gait abnormality, CT scan showed bilateral basal ganglia chronic lacunar infarcts, 10 mm hypodense focus right pons could be infarct or artifact? MRI was negative for acute infarct, did show atrophy moderate SVD. Discharged to continue Plavix, statin. Since 2/8, feels weak generally, feels more shuffling with gait, trouble getting out of chair. Just started PT/OT this week at home. Is taking Sinemet 25/100, 2 in AM, 2 midday, 2 evening, 1.5 at bedtime. Azilect 1 mg daily. MMSE 28/30. 50% of his self since event. No falls. This reminds his wife of when he had his first stroke (called his  wife in to the bedroom, felt couldn't move, dizzy)-same presentation this time. MMSE 28/30.  Labs from ER visit, showed urine with moderate Blood but no infection, CBC CMP showed no significant abnormality.   CTA head:   1. No intracranial large vessel occlusion is identified.  2.  Intracranial atherosclerotic disease with multifocal stenoses,  most notably as follows.  3. Severe stenosis within the A3 right anterior cerebral artery.  4. Moderate stenosis within the A3 left anterior cerebral artery.  5. Severe stenosis within the left anterior cerebral artery at the  A3/A4 junction.  6. Severe stenosis within the right PCA at the P2/P3 junction.   UPDATE August 14th 2023: Getitng slower,g etting up from chair, sinement 9am, 1pm, 6pm, 10pm,  take 2 tabs Goes to sleep, gets up sleeps well, in one spot, use once at night,   He has side effect,   No difference. Empty stomch,   He spend most time sitting around.  Get the mail.  REVIEW OF SYSTEMS: Out of a complete 14 system review of symptoms, the patient complains only of the following symptoms, and all other reviewed systems are negative.  See HPI  ALLERGIES: No Known Allergies  HOME MEDICATIONS: Outpatient Medications Prior to Visit  Medication Sig Dispense Refill   amLODipine-benazepril (LOTREL) 5-20 MG capsule Take 1 capsule by mouth daily.     carbidopa-levodopa (SINEMET IR) 25-100 MG tablet Take 2 tablets by mouth 4 (four) times daily. 720 tablet 3   cetirizine (ZYRTEC) 10 MG tablet Take 10 mg by mouth daily as needed for allergies.     clopidogrel (PLAVIX) 75 MG tablet TAKE 1 TABLET BY MOUTH EVERY DAY 90 tablet 4   metFORMIN (GLUCOPHAGE) 1000 MG tablet Take 1,000 mg by mouth 2 (two) times daily with a meal.  3   Multiple Vitamins-Minerals (PRESERVISION AREDS 2+MULTI VIT) CAPS Take by mouth.     rasagiline (AZILECT) 1 MG TABS tablet Take 1 tablet (1 mg total) by mouth daily. 30 tablet 0   simvastatin (ZOCOR) 10 MG tablet Take 10 mg by mouth daily.     Vibegron (GEMTESA) 75 MG TABS Take 75 mg by mouth daily.     nitroGLYCERIN (NITROSTAT) 0.4 MG SL tablet Place 1 tablet (0.4 mg total) under the tongue every 5 (five) minutes as needed for chest pain. 25 tablet 3   No facility-administered medications prior  to visit.    PAST MEDICAL HISTORY: Past Medical History:  Diagnosis Date   Abnormal gait    Fatigue    Hypertension    Prostate cancer ([place])    Tremor    Vitamin D deficiency     PAST SURGICAL HISTORY: Past Surgical History:  Procedure Laterality Date   CARDIAC SURGERY     bypass   COLONOSCOPY N/A 02/09/2017   Procedure: COLONOSCOPY;  Surgeon: Aviva Signs, MD;  [facility]: [facility];  Service: Gastroenterology;  Laterality: N/A;   HEMORROIDECTOMY     KNEE SURGERY Right     FAMILY HISTORY: Family History  Problem Relation Age of Onset   Dementia Mother    Diabetes Father    Lung cancer Father    Breast cancer Sister    Multiple myeloma Brother     SOCIAL HISTORY: Social History   Socioeconomic History   Marital status: Married    Spouse name: Not on file   Number of children: 1   Years of education: 12   Highest education level: High school graduate  Occupational History  Occupation: Retired  Tobacco Use   Smoking status: Former    Packs/day: 1.00    Years: 9.00    Total pack years: 9.00    Types: Cigarettes    Quit date: 02/25/1971    Years since quitting: 51.2   Smokeless tobacco: Former    Types: Snuff  Vaping Use   Vaping Use: Never used  Substance and Sexual Activity   Alcohol use: No    Alcohol/week: 0.0 standard drinks of alcohol   Drug use: No   Sexual activity: Not on file  Other Topics Concern   Not on file  Social History Narrative   Lives at home with his wife.   Right-handed.   2 cups caffeine per day.   Social Determinants of Health   Financial Resource Strain: Not on file  Food Insecurity: Not on file  Transportation Needs: Not on file  Physical Activity: Not on file  Stress: Not on file  Social Connections: Not on file  Intimate Partner Violence: Not on file   PHYSICAL EXAM  Vitals:   05/04/22 1310  BP: 132/73  Pulse: 86  Weight: 181 lb (82.1 kg)  Height: 5' 9" (1.753 m)   Body mass index is 26.73  kg/m.  Generalized: Well developed, in no acute distress     11/12/2021    1:12 PM  MMSE - Mini Mental State Exam  Orientation to time 4  Orientation to Place 5  Registration 3  Attention/ Calculation 5  Recall 2  Language- name 2 objects 2  Language- repeat 1  Language- follow 3 step command 3  Language- read & follow direction 1  Write a sentence 1  Copy design 1  Copy design-comments 4 legged animals x 1 min: 7  Total score 28    Neurological examination  Mentation: Alert oriented to time, place, history taking. Follows all commands speech and language fluent Cranial nerve II-XII: Pupils were equal round reactive to light. Extraocular movements were full, visual field were full on confrontational test. Facial sensation and strength were normal.  Head turning and shoulder shrug  were normal and symmetric. Motor: The motor testing reveals 5 over 5 strength of all 4 extremities. Good symmetric motor tone is noted throughout. Mild bradykinesia, right > left  Sensory: Sensory testing is intact to soft touch on all 4 extremities. No evidence of extinction is noted.  Coordination: Cerebellar testing reveals good finger-nose-finger and heel-to-shin bilaterally.  Deep tendon reflexes: Hypoactive symmetric bilaterally Gait and station:  push off from seated position cautious,  leaning forward, moderate stride,  DIAGNOSTIC DATA (LABS, IMAGING, TESTING) - I reviewed patient records, labs, notes, testing and imaging myself where available.  Lab Results  Component Value Date   WBC 4.7 12/25/2010   HGB 13.8 12/25/2010   HCT 40.5 12/25/2010   MCV 91.8 12/25/2010   PLT 232 12/25/2010      Component Value Date/Time   NA 138 12/25/2010 1347   K 3.9 12/25/2010 1347   CL 104 12/25/2010 1347   CO2 27 12/25/2010 1347   GLUCOSE 174 (H) 12/25/2010 1347   BUN 13 12/25/2010 1347   CREATININE 0.80 11/30/2019 1311   CALCIUM 9.5 12/25/2010 1347   PROT 6.8 04/01/2010 0900   ALBUMIN 3.9  04/01/2010 0900   AST 21 04/01/2010 0900   ALT 21 04/01/2010 0900   ALKPHOS 72 04/01/2010 0900   BILITOT 0.8 04/01/2010 0900   GFRNONAA >60 12/25/2010 1347   GFRAA  12/25/2010 1347    >  60        The eGFR has been calculated using the MDRD equation. This calculation has not been validated in all clinical situations. eGFR's persistently <60 mL/min signify possible Chronic Kidney Disease.   No results found for: "CHOL", "HDL", "[place]", "LDLDIRECT", "TRIG", "CHOLHDL" No results found for: "HGBA1C" Lab Results  Component Value Date   VITAMINB12 284 11/07/2019   Lab Results  Component Value Date   TSH 1.390 11/07/2019   ASSESSMENT AND PLAN 81 y.o. year old male   1.  Parkinson's disease 2.  History of stroke  He complains of lack of stamina, increased gait abnormality,  Only has mild parkinsonian features 4 hours after previous dose of Sinemet 25/100 mg 1 tablet 3 times a day, will try higher dose of Sinemet 2 tablets 3 times a day to see if this will improve his gait difficulty,  I suspected his gait difficulties mostly related to his aging, deconditioning,  Emphasized importance of moderate exercise, gradually increase his stamina  Refill Plavix 75 mg daily  Return to clinic with nurse practitioner in 6 months   Marcial Pacas, M.D. Ph.D.  [facility] [place], [place] [address] Phone: [phone] Fax:      [phone]

## 2022-05-21 DIAGNOSIS — D3131 Benign neoplasm of right choroid: Secondary | ICD-10-CM | POA: Diagnosis not present

## 2022-05-21 DIAGNOSIS — H43813 Vitreous degeneration, bilateral: Secondary | ICD-10-CM | POA: Diagnosis not present

## 2022-05-21 DIAGNOSIS — H35372 Puckering of macula, left eye: Secondary | ICD-10-CM | POA: Diagnosis not present

## 2022-05-21 DIAGNOSIS — H353231 Exudative age-related macular degeneration, bilateral, with active choroidal neovascularization: Secondary | ICD-10-CM | POA: Diagnosis not present

## 2022-07-02 DIAGNOSIS — H353231 Exudative age-related macular degeneration, bilateral, with active choroidal neovascularization: Secondary | ICD-10-CM | POA: Diagnosis not present

## 2022-07-20 ENCOUNTER — Ambulatory Visit: Payer: Medicare Other | Attending: Internal Medicine | Admitting: Internal Medicine

## 2022-07-20 ENCOUNTER — Encounter: Payer: Self-pay | Admitting: Internal Medicine

## 2022-07-20 VITALS — BP 140/82 | HR 79 | Ht 69.0 in | Wt 177.4 lb

## 2022-07-20 DIAGNOSIS — I25708 Atherosclerosis of coronary artery bypass graft(s), unspecified, with other forms of angina pectoris: Secondary | ICD-10-CM | POA: Diagnosis not present

## 2022-07-20 NOTE — Progress Notes (Signed)
Cardiology Office Note  Date: 07/20/2022   ID: [name], DOB [birth year], MRN [id number]  PCP:  Celene Squibb, MD  Cardiologist:  Chalmers Guest, MD Electrophysiologist:  None   Reason for Office Visit: Follow-up of CAD   History of Present Illness: [name] is a 81 y.o. male known to have CAD status post CABG in 1999 at [facility], history of stroke, Parkinson's disease, HTN, HLD presented to cardiology clinic for follow-up visit. Patient had a normal nuclear stress test in 2017 and echocardiogram from 2021 showed normal left ventricular systolic function, LVEF 50 to 67% and mild diastolic dysfunction. Patient is here for follow-up visit, accompanied by wife. Denied any angina, DOE, dizziness, lightness, syncope, LE swelling. Compliant with medications and no side effects. Denied smoking cigarettes, alcohol use or illicit drug abuse. Physically not active at baseline due to Parkinson's disease.  Past Medical History:  Diagnosis Date   Abnormal gait    Fatigue    Hypertension    Prostate cancer ([place])    Tremor    Vitamin D deficiency     Past Surgical History:  Procedure Laterality Date   CARDIAC SURGERY     bypass   COLONOSCOPY N/A 02/09/2017   Procedure: COLONOSCOPY;  Surgeon: Aviva Signs, MD;  [facility];  Service: Gastroenterology;  Laterality: N/A;   HEMORROIDECTOMY     KNEE SURGERY Right     Current Outpatient Medications  Medication Sig Dispense Refill   amLODipine (NORVASC) 10 MG tablet Take 10 mg by mouth daily.     carbidopa-levodopa (SINEMET IR) 25-100 MG tablet Take 2 tablets by mouth 3 (three) times daily. 540 tablet 3   clopidogrel (PLAVIX) 75 MG tablet Take 1 tablet (75 mg total) by mouth daily. 90 tablet 4   metFORMIN (GLUCOPHAGE) 1000 MG tablet Take 1,000 mg by mouth 2 (two) times daily with a meal.  3   Multiple Vitamins-Minerals (PRESERVISION AREDS 2+MULTI VIT) CAPS Take 1 tablet by mouth in the morning and at bedtime.      simvastatin (ZOCOR) 10 MG tablet Take 10 mg by mouth daily.     amLODipine-benazepril (LOTREL) 5-20 MG capsule Take 1 capsule by mouth daily. (Patient not taking: Reported on 07/20/2022)     cetirizine (ZYRTEC) 10 MG tablet Take 10 mg by mouth daily as needed for allergies. (Patient not taking: Reported on 07/20/2022)     nitroGLYCERIN (NITROSTAT) 0.4 MG SL tablet DISSOLVE 1 TABLET UNDER THE TONGUE EVERY 5 MINUTES AS NEEDED FOR CHEST PAIN. DO NOT EXCEED A TOTAL OF 3 DOSES IN 15 MINUTES.     Vibegron (GEMTESA) 75 MG TABS Take 75 mg by mouth daily. (Patient not taking: Reported on 07/20/2022)     No current facility-administered medications for this visit.   Allergies:  Patient has no known allergies.   Social History: The patient  reports that he quit smoking about 51 years ago. His smoking use included cigarettes. He has a 9.00 pack-year smoking history. He has never been exposed to tobacco smoke. He has quit using smokeless tobacco.  His smokeless tobacco use included snuff. He reports that he does not drink alcohol and does not use drugs.   Family History: The patient's family history includes Breast cancer in his sister; Dementia in his mother; Diabetes in his father; Lung cancer in his father; Multiple myeloma in his brother.   ROS:  Please see the history of present illness. Otherwise, complete review of systems is  positive for none.  All other systems are reviewed and negative.   Physical Exam: VS:  BP (!) 140/82 (BP Location: Left Arm, Patient Position: Sitting, Cuff Size: Normal)   Pulse 79   Ht _0  (1.753 m)   Wt 177 lb 6.4 oz (80.5 kg)   SpO2 97%   BMI 26.20 kg/m , BMI Body mass index is 26.2 kg/m.  Wt Readings from Last 3 Encounters:  07/20/22 177 lb 6.4 oz (80.5 kg)  05/04/22 181 lb (82.1 kg)  11/12/21 176 lb (79.8 kg)    General: Patient appears comfortable at rest. HEENT: Conjunctiva and lids normal, oropharynx clear with moist mucosa. Neck: Supple, no elevated JVP  or carotid bruits, no thyromegaly. Lungs: Clear to auscultation, nonlabored breathing at rest. Cardiac: Regular rate and rhythm, no S3 or significant systolic murmur, no pericardial rub. Abdomen: Soft, nontender, no hepatomegaly, bowel sounds present, no guarding or rebound. Extremities: No pitting edema, distal pulses 2+. Skin: Warm and dry. Musculoskeletal: No kyphosis. Neuropsychiatric: Alert and oriented x3, affect grossly appropriate.  ECG:  An ECG dated 07/20/2022 was personally reviewed today and demonstrated:  Normal sinus rhythm and no ST-T changes  Recent Labwork: No results found for requested labs within last 365 days.  No results found for: "CHOL", "TRIG", "HDL", "CHOLHDL", "VLDL", "LDLCALC", "LDLDIRECT"  Other Studies Reviewed Today: Echo in 2021 1. Left ventricular ejection fraction, by estimation, is 50 to 55%. The  left ventricle has low normal function. The left ventricle has no regional  wall motion abnormalities. The left ventricular internal cavity size was  mildly dilated. There is mild  left ventricular hypertrophy. Left ventricular diastolic parameters are  consistent with Grade I diastolic dysfunction (impaired relaxation).   2. Right ventricular systolic function is normal. The right ventricular  size is normal. There is normal pulmonary artery systolic pressure. The  estimated right ventricular systolic pressure is 50.5 mmHg.   3. The mitral valve is grossly normal. Trivial mitral valve  regurgitation.   4. The aortic valve is tricuspid. Aortic valve regurgitation is not  visualized. Mild aortic valve sclerosis is present, with no evidence of  aortic valve stenosis.   5. The inferior vena cava is normal in size with greater than 50%  respiratory variability, suggesting right atrial pressure of 3 mmHg.   6. The atrial septum is mobile with apparent bidirectional shunting by  color Doppler consistent with PFO, subcostals are suboptimal. Suggest  bubble  study.  NM stress test in 2017 No evidence of ischemia  Assessment and Plan: Patient is a 81 year old M known to have CAD status post CABG 1999 at [facility] with normal LVEF 50 to 55%, HTN, HLD, history of stroke, Parkinson's disease presented to cardiology clinic for follow-up visit.  Accompanied by wife.  #CAD status post CABG 1999 at [facility] with normal LVEF 50 to 55% Plan -Due to history of stroke and presence of CABG, aspirin was switched to Plavix in the past. Continue Plavix 75 mg once daily.  -Continue simvastatin 10 mg nightly -SL NTG 0.4 mg as needed -ER precautions for chest pain provided  #HLD, at goal per patient's wife Plan -Continue simvastatin 10 mg nightly.  Patient's wife stated that he had a lipid panel checked in July 2023 by his PCP and was told it was normal.  #HTN, controlled Plan -Continue amlodipine 10 mg once daily  I have spent a total of 34 minutes with patient reviewing chart, EKGs, labs and examining patient as well as  establishing an assessment and plan that was discussed with the patient.  > 50% of time was spent in direct patient care.     Medication Adjustments/Labs and Tests Ordered: Current medicines are reviewed at length with the patient today.  Concerns regarding medicines are outlined above.   Tests Ordered: Orders Placed This Encounter  Procedures   EKG 12-Lead    Medication Changes: No orders of the defined types were placed in this encounter.   Disposition:  Follow up  1 year  Signed [name] Fidel Levy, MD, 07/20/2022 3:18 PM    [facility], [place], [place] [address]

## 2022-07-20 NOTE — Patient Instructions (Addendum)

## 2022-07-23 DIAGNOSIS — Z742 Need for assistance at home and no other household member able to render care: Secondary | ICD-10-CM | POA: Diagnosis not present

## 2022-07-23 DIAGNOSIS — L0291 Cutaneous abscess, unspecified: Secondary | ICD-10-CM | POA: Diagnosis not present

## 2022-07-28 ENCOUNTER — Other Ambulatory Visit: Payer: Self-pay | Admitting: *Deleted

## 2022-07-28 DIAGNOSIS — L723 Sebaceous cyst: Secondary | ICD-10-CM

## 2022-08-04 ENCOUNTER — Encounter: Payer: Self-pay | Admitting: General Surgery

## 2022-08-04 ENCOUNTER — Ambulatory Visit: Payer: Medicare Other | Admitting: General Surgery

## 2022-08-04 ENCOUNTER — Other Ambulatory Visit: Payer: Self-pay

## 2022-08-04 VITALS — BP 145/79 | HR 86 | Temp 98.2°F | Resp 18 | Ht 69.0 in | Wt 175.0 lb

## 2022-08-04 DIAGNOSIS — L723 Sebaceous cyst: Secondary | ICD-10-CM | POA: Diagnosis not present

## 2022-08-04 NOTE — Progress Notes (Signed)
[name]; [id number]; 10-29-1940   HPI Patient is an 81 year old white male who was referred back to my care by Dr. Allyn Kenner for evaluation and treatment of a sebaceous cyst on his back.  It has been present for some time now, but decreased in size and became painful to touch.  They were able to express some purulent fluid and seeping from it.  His wife was taking care of it for him.  It is still present but not as tender. Past Medical History:  Diagnosis Date   Abnormal gait    Fatigue    Hypertension    Prostate cancer ([place])    Tremor    Vitamin D deficiency     Past Surgical History:  Procedure Laterality Date   CARDIAC SURGERY     bypass   COLONOSCOPY N/A 02/09/2017   Procedure: COLONOSCOPY;  Surgeon: Aviva Signs, MD;  [facility];  Service: Gastroenterology;  Laterality: N/A;   HEMORROIDECTOMY     KNEE SURGERY Right     Family History  Problem Relation Age of Onset   Dementia Mother    Diabetes Father    Lung cancer Father    Breast cancer Sister    Multiple myeloma Brother     Current Outpatient Medications on File Prior to Visit  Medication Sig Dispense Refill   amLODipine-benazepril (LOTREL) 5-20 MG capsule Take 1 capsule by mouth daily.     carbidopa-levodopa (SINEMET IR) 25-100 MG tablet Take 2 tablets by mouth 3 (three) times daily. 540 tablet 3   cetirizine (ZYRTEC) 10 MG tablet Take 10 mg by mouth daily as needed for allergies.     clopidogrel (PLAVIX) 75 MG tablet Take 1 tablet (75 mg total) by mouth daily. 90 tablet 4   metFORMIN (GLUCOPHAGE) 1000 MG tablet Take 1,000 mg by mouth 2 (two) times daily with a meal.  3   Multiple Vitamins-Minerals (PRESERVISION AREDS 2+MULTI VIT) CAPS Take 1 tablet by mouth in the morning and at bedtime.     nitroGLYCERIN (NITROSTAT) 0.4 MG SL tablet DISSOLVE 1 TABLET UNDER THE TONGUE EVERY 5 MINUTES AS NEEDED FOR CHEST PAIN. DO NOT EXCEED A TOTAL OF 3 DOSES IN 15 MINUTES.     simvastatin (ZOCOR) 10 MG tablet  Take 10 mg by mouth daily.     No current facility-administered medications on file prior to visit.    No Known Allergies  Social History   Substance and Sexual Activity  Alcohol Use No   Alcohol/week: 0.0 standard drinks of alcohol    Social History   Tobacco Use  Smoking Status Former   Packs/day: 1.00   Years: 9.00   Total pack years: 9.00   Types: Cigarettes   Quit date: 02/25/1971   Years since quitting: 51.4   Passive exposure: Never  Smokeless Tobacco Former   Types: Snuff    Review of Systems  Constitutional:  Positive for malaise/fatigue.  HENT:  Positive for sinus pain.   Eyes:  Positive for blurred vision.  Respiratory: Negative.    Cardiovascular: Negative.   Gastrointestinal: Negative.   Genitourinary: Negative.   Musculoskeletal:  Positive for joint pain.  Skin:  Negative for itching and rash.  Neurological: Negative.   Endo/Heme/Allergies: Negative.   Psychiatric/Behavioral: Negative.      Objective   Vitals:   08/04/22 1312  BP: (!) 145/79  Pulse: 86  Resp: 18  Temp: 98.2 F (36.8 C)  SpO2: 97%    Physical Exam Vitals reviewed.  Constitutional:      Appearance: Normal appearance. He is normal weight. He is not ill-appearing.  HENT:     Head: Normocephalic and atraumatic.  Cardiovascular:     Rate and Rhythm: Normal rate and regular rhythm.     Heart sounds: Normal heart sounds. No murmur heard.    No friction rub. No gallop.  Pulmonary:     Effort: Pulmonary effort is normal. No respiratory distress.     Breath sounds: Normal breath sounds. No stridor. No wheezing, rhonchi or rales.  Genitourinary:    Comments: Left buttock lipoma, 3 cm in size. Skin:    General: Skin is warm and dry.     Comments: 3 cm tender sebaceous cyst with punctum present on mid back.  I was able to express more sebum.  Neurological:     Mental Status: He is alert and oriented to person, place, and time.     Assessment  Sebaceous cyst, back Lipoma,  left buttock Plan  Patient was instructed to continue expressing sebum from the back wound.  He does take Plavix and would need this stopped prior to any surgical intervention.  I will see him again in 3 weeks for follow-up.  We will reassess at that time.

## 2022-08-20 DIAGNOSIS — H35372 Puckering of macula, left eye: Secondary | ICD-10-CM | POA: Diagnosis not present

## 2022-08-20 DIAGNOSIS — H353231 Exudative age-related macular degeneration, bilateral, with active choroidal neovascularization: Secondary | ICD-10-CM | POA: Diagnosis not present

## 2022-08-20 DIAGNOSIS — D3130 Benign neoplasm of unspecified choroid: Secondary | ICD-10-CM | POA: Diagnosis not present

## 2022-08-20 DIAGNOSIS — H43813 Vitreous degeneration, bilateral: Secondary | ICD-10-CM | POA: Diagnosis not present

## 2022-08-25 ENCOUNTER — Encounter: Payer: Self-pay | Admitting: General Surgery

## 2022-08-25 ENCOUNTER — Ambulatory Visit: Payer: Medicare Other | Admitting: General Surgery

## 2022-08-25 VITALS — BP 165/80 | HR 87 | Temp 97.7°F | Resp 12 | Ht 69.0 in | Wt 175.0 lb

## 2022-08-25 DIAGNOSIS — L723 Sebaceous cyst: Secondary | ICD-10-CM

## 2022-08-25 NOTE — Progress Notes (Signed)
Subjective:     [name]  Here for follow-up of sebaceous cyst on his back.  No drainage has been noted for over a week.  He has no complaints. Objective:    BP (!) 165/80   Pulse 87   Temp 97.7 F (36.5 C) (Oral)   Resp 12   Ht '5\' 9"'$  (1.753 m)   Wt 175 lb (79.4 kg)   SpO2 98%   BMI 25.84 kg/m   General:  alert, cooperative, and no distress  Back exam reveals resolution of the sebaceous cyst.  Only a punctum is present.  No significant induration or erythema is present.     Assessment:    Sebaceous cyst, back, resolved    Plan:   No need for surgical intervention at this time.  Patient was instructed to return to my care should it recur.  Follow-up as needed.

## 2022-10-01 DIAGNOSIS — H353231 Exudative age-related macular degeneration, bilateral, with active choroidal neovascularization: Secondary | ICD-10-CM | POA: Diagnosis not present

## 2022-11-05 ENCOUNTER — Encounter: Payer: Self-pay | Admitting: Neurology

## 2022-11-05 ENCOUNTER — Ambulatory Visit: Payer: Medicare Other | Admitting: Neurology

## 2022-11-05 VITALS — BP 153/73 | HR 88 | Ht 69.0 in | Wt 172.0 lb

## 2022-11-05 DIAGNOSIS — I639 Cerebral infarction, unspecified: Secondary | ICD-10-CM

## 2022-11-05 DIAGNOSIS — G20C Parkinsonism, unspecified: Secondary | ICD-10-CM | POA: Diagnosis not present

## 2022-11-05 DIAGNOSIS — R269 Unspecified abnormalities of gait and mobility: Secondary | ICD-10-CM

## 2022-11-05 MED ORDER — CARBIDOPA-LEVODOPA 25-100 MG PO TABS: 2.0000 | ORAL_TABLET | Freq: Three times a day (TID) | ORAL | 3 refills | Status: DC

## 2022-11-05 NOTE — Patient Instructions (Addendum)
I will order home health to come out for PT, OT, nursing Follow up with primary care doctor about weight loss Continue Sinemet See you back in 6 months

## 2022-11-05 NOTE — Progress Notes (Addendum)
PRIMARY NEUROLOGIST:  [name] is a 82 year old male, seen in request by his primary care physician Dr. Allyn Day for evaluation of gait abnormality, he is accompanied by his wife at today's clinical visit on November 07, 2019.   I have reviewed and summarized the referring note from the referring physician.  He had a past medical history of hypertension, hyperlipidemia, diabetes, coronary artery disease,   He was previously very active, "very physical", enjoying his yard work in the summertime, around November 2020, he noticed gradual onset gait abnormality, fell few times, acute worsening since he fell at the end of December 2020, he fell in the bathtub on his back, following that fall, he had 2 weeks history of urinary incontinence, now gradually improved, but his gait abnormality remains, he was noted to have small stride, careful, stiff gait,   He denied loss of sense of smell, denies REM sleep disorder or memory loss, denies hand tremor, denies bilateral upper or lower extremity paresthesia, denies significant pain, he denies double vision, swallowing difficulty, ptosis,   Laboratory evaluations in January 2021, normal CBC hemoglobin of 13.9, CMP, creatinine of 0.94, LDL was 68, A1c was 7.0,   UPDATE December 21 2019: He continue complains of unsteady gait, seems to have more trouble on his right side   MRI of brain in March 2021, small subacute lacunar infarction in the right cerebellum hemisphere,also chronic microvascular ischemic changes in the right middle cerebellar peduncle, left pons, subcortical, periventricular, deep white matter's,   MRI of cervical spine showed multilevel degenerative changes, but there was no evidence of spinal cord or canal stenosis.   Laboratory evaluation February 2021, normal W09, RPR, folic acid, C-reactive protein, TSH, CPK, ESR, ANA, acetylcholine receptor antibody   UPDATE April 23 2020: Snienemt has helped his walking, reported mild to  moderate improvement, taking it at 9 AM, 1 PM, 6 PM, noticed elevated 30 minutes after medication, also noticed wearing off before the next dose, he is not ambulated with a cane, physical therapy was helpful as well   He denied loss sense of smell, no REM sleep disorder, no significant side effect from Sinemet dose to try higher dose   Echocardiogram showed ejection fraction 50 to 55%, no right-to-left shunt by bubble study Ultrasound of carotid artery showed less than 39% stenosis bilaterally, is taking Plavix 75 mg only   UPDATE May 12 2021: He fell in tub, had incontinence, quit driving in Feb 8119.  Now almost back to his baseline, but overall slow decline, He took sinemet 9am, 2pm, 6pm,  it does help him moves better, he noticed wearing off, also noticed worsening memory loss   Ultrasound of carotid artery May 2021 less than 39% stenosis bilaterally, antegrade flow bilateral vertebral artery   Echocardiogram in May 2021, no evidence of intracardiac shunting  Update November 12, 2021 SS: In ER [facility] 10/29/21 for gait abnormality, CT scan showed bilateral basal ganglia chronic lacunar infarcts, 10 mm hypodense focus right pons could be infarct or artifact? MRI was negative for acute infarct, did show atrophy moderate SVD. Discharged to continue Plavix, statin. Since 2/8, feels weak generally, feels more shuffling with gait, trouble getting out of chair. Just started PT/OT this week at home. Is taking Sinemet 25/100, 2 in AM, 2 midday, 2 evening, 1.5 at bedtime. Azilect 1 mg daily. MMSE 28/30. 50% of his self since event. No falls. This reminds his wife of when he had his first stroke (called his  wife in to the bedroom, felt couldn't move, dizzy)-same presentation this time. MMSE 28/30.  Labs from ER visit, showed urine with moderate Blood but no infection, CBC CMP showed no significant abnormality.   CTA head:   1. No intracranial large vessel occlusion is identified.  2.  Intracranial atherosclerotic disease with multifocal stenoses,  most notably as follows.  3. Severe stenosis within the A3 right anterior cerebral artery.  4. Moderate stenosis within the A3 left anterior cerebral artery.  5. Severe stenosis within the left anterior cerebral artery at the  A3/A4 junction.  6. Severe stenosis within the right PCA at the P2/P3 junction.   UPDATE August 14th 2023: Getitng slower,g etting up from chair, sinement 9am, 1pm, 6pm, 10pm,  take 2 tabs Goes to sleep, gets up sleeps well, in one spot, use once at night,   He has side effect,   No difference. Empty stomch,   He spend most time sitting around.  Get the mail.  Update November 05, 2022 SS: has lost 9 lbs since August 2023. Has a good appetite. Walking is worsening, isn't picking up his feet, 4-5 falls. Using walker. Having bathroom accidents, he doesn't smell it, his wife overwhelmed with cleaning, has been going for several years. Has macular degeneration, vision is poor for ADLs. Takes Sinemet 25/100 mg 2 tables 3 times daily . He is sedentary. On metformin. He complains of nothing hurting. No blood loss.   Labs from PCP 12/19/2022 A1c 5.9, cholesterol 152, triglyceride 161, LDL 85, glucose 122, creatinine 0.83, AST 7, ALT 6, Hgb 13.7, platelet 303.  REVIEW OF SYSTEMS: Out of a complete 14 system review of symptoms, the patient complains only of the following symptoms, and all other reviewed systems are negative.  See HPI  ALLERGIES: No Known Allergies  HOME MEDICATIONS: Outpatient Medications Prior to Visit  Medication Sig Dispense Refill   amLODipine-benazepril (LOTREL) 5-20 MG capsule Take 1 capsule by mouth daily.     cetirizine (ZYRTEC) 10 MG tablet Take 10 mg by mouth daily as needed for allergies.     clopidogrel (PLAVIX) 75 MG tablet Take 1 tablet (75 mg total) by mouth daily. 90 tablet 4   metFORMIN (GLUCOPHAGE) 1000 MG tablet Take 1,000 mg by mouth 2 (two) times daily with a meal.  3    Multiple Vitamins-Minerals (PRESERVISION AREDS 2+MULTI VIT) CAPS Take 1 tablet by mouth in the morning and at bedtime.     nitroGLYCERIN (NITROSTAT) 0.4 MG SL tablet DISSOLVE 1 TABLET UNDER THE TONGUE EVERY 5 MINUTES AS NEEDED FOR CHEST PAIN. DO NOT EXCEED A TOTAL OF 3 DOSES IN 15 MINUTES.     simvastatin (ZOCOR) 10 MG tablet Take 10 mg by mouth daily.     carbidopa-levodopa (SINEMET IR) 25-100 MG tablet Take 2 tablets by mouth 3 (three) times daily. 540 tablet 3   No facility-administered medications prior to visit.    PAST MEDICAL HISTORY: Past Medical History:  Diagnosis Date   Abnormal gait    Fatigue    Hypertension    Prostate cancer (HCC)    Tremor    Vitamin D deficiency     PAST SURGICAL HISTORY: Past Surgical History:  Procedure Laterality Date   CARDIAC SURGERY     bypass   COLONOSCOPY N/A 02/09/2017   Procedure: COLONOSCOPY;  Surgeon: Franky Macho, MD;  Location: [facility];  Service: Gastroenterology;  Laterality: N/A;   HEMORROIDECTOMY     KNEE SURGERY Right     FAMILY  HISTORY: Family History  Problem Relation Age of Onset   Dementia Mother    Diabetes Father    Lung cancer Father    Breast cancer Sister    Multiple myeloma Brother     SOCIAL HISTORY: Social History   Socioeconomic History   Marital status: Married    Spouse name: Not on file   Number of children: 1   Years of education: 12   Highest education level: High school graduate  Occupational History   Occupation: Retired  Tobacco Use   Smoking status: Former    Packs/day: 1.00    Years: 9.00    Total pack years: 9.00    Types: Cigarettes    Quit date: 02/25/1971    Years since quitting: 51.7    Passive exposure: Never   Smokeless tobacco: Former    Types: Snuff  Vaping Use   Vaping Use: Never used  Substance and Sexual Activity   Alcohol use: No    Alcohol/week: 0.0 standard drinks of alcohol   Drug use: No   Sexual activity: Not on file  Other Topics Concern   Not on file   Social History Narrative   Lives at home with his wife.   Right-handed.   2 cups caffeine per day.   Social Determinants of Health   Financial Resource Strain: Not on file  Food Insecurity: Not on file  Transportation Needs: Not on file  Physical Activity: Not on file  Stress: Not on file  Social Connections: Not on file  Intimate Partner Violence: Not on file   PHYSICAL EXAM  Vitals:   11/05/22 1311  BP: (!) 153/73  Pulse: 88  Weight: 172 lb (78 kg)  Height: 5\' 9"  (1.753 m)    Body mass index is 25.4 kg/m.  Generalized: Well developed, in no acute distress     11/12/2021    1:12 PM  MMSE - Mini Mental State Exam  Orientation to time 4  Orientation to Place 5  Registration 3  Attention/ Calculation 5  Recall 2  Language- name 2 objects 2  Language- repeat 1  Language- follow 3 step command 3  Language- read & follow direction 1  Write a sentence 1  Copy design 1  Copy design-comments 4 legged animals x 1 min: 7  Total score 28    Neurological examination  Mentation: Alert, cooperative, very nice, wife provides history . Follows all commands speech is slightly slow Cranial nerve II-XII: Pupils were equal round reactive to light. Extraocular movements were full, visual field were full on confrontational test. Facial sensation and strength were normal.  Head turning and shoulder shrug  were normal and symmetric. Motor: Good strength overall,  Mild bradykinesia, right > left, no tremor  Sensory: Sensory testing is intact to soft touch on all 4 extremities. No evidence of extinction is noted.  Coordination: Cerebellar testing reveals good finger-nose-finger and heel-to-shin bilaterally.  Deep tendon reflexes: Hypoactive symmetric bilaterally Gait and station:  push off from seated position, cautious, wide based, relies on rolling walker in the house  DIAGNOSTIC DATA (LABS, IMAGING, TESTING) - I reviewed patient records, labs, notes, testing and imaging myself  where available.  Lab Results  Component Value Date   WBC 4.7 12/25/2010   HGB 13.8 12/25/2010   HCT 40.5 12/25/2010   MCV 91.8 12/25/2010   PLT 232 12/25/2010      Component Value Date/Time   NA 138 12/25/2010 1347   K 3.9 12/25/2010 1347  CL 104 12/25/2010 1347   CO2 27 12/25/2010 1347   GLUCOSE 174 (H) 12/25/2010 1347   BUN 13 12/25/2010 1347   CREATININE 0.80 11/30/2019 1311   CALCIUM 9.5 12/25/2010 1347   PROT 6.8 04/01/2010 0900   ALBUMIN 3.9 04/01/2010 0900   AST 21 04/01/2010 0900   ALT 21 04/01/2010 0900   ALKPHOS 72 04/01/2010 0900   BILITOT 0.8 04/01/2010 0900   GFRNONAA >60 12/25/2010 1347   GFRAA  12/25/2010 1347    >60        The eGFR has been calculated using the MDRD equation. This calculation has not been validated in all clinical situations. eGFR's persistently <60 mL/min signify possible Chronic Kidney Disease.   No results found for: "CHOL", "HDL", "LDLCALC", "LDLDIRECT", "TRIG", "CHOLHDL" No results found for: "HGBA1C" Lab Results  Component Value Date   VITAMINB12 284 11/07/2019   Lab Results  Component Value Date   TSH 1.390 11/07/2019   ASSESSMENT AND PLAN 82 y.o. year old male   1.  Parkinson's disease 2.  History of stroke  -Referral to Home Health for nursing, PT, OT, needing more help at home, continued gait abnormalities -Needs to increase activity, is very sedentary  -Continue current Sinemet 2 tablet 3 times daily, he was taking 2 tablets 4 times daily, didn't see any improvement, Dr. Terrace Arabia reduced it last visit -Follow up with primary care doctor about weight loss, monitor this -Continue Plavix 75 mg daily  -Follow up in 6 months   Otila Kluver, DNP  [facility] [address] [place], [place] [address] [phone]

## 2022-11-19 DIAGNOSIS — H353231 Exudative age-related macular degeneration, bilateral, with active choroidal neovascularization: Secondary | ICD-10-CM | POA: Diagnosis not present

## 2022-11-19 DIAGNOSIS — H43813 Vitreous degeneration, bilateral: Secondary | ICD-10-CM | POA: Diagnosis not present

## 2022-11-19 DIAGNOSIS — D3131 Benign neoplasm of right choroid: Secondary | ICD-10-CM | POA: Diagnosis not present

## 2022-11-19 DIAGNOSIS — H35372 Puckering of macula, left eye: Secondary | ICD-10-CM | POA: Diagnosis not present

## 2022-12-10 DIAGNOSIS — L110 Acquired keratosis follicularis: Secondary | ICD-10-CM | POA: Diagnosis not present

## 2022-12-10 DIAGNOSIS — M79672 Pain in left foot: Secondary | ICD-10-CM | POA: Diagnosis not present

## 2022-12-10 DIAGNOSIS — M79674 Pain in right toe(s): Secondary | ICD-10-CM | POA: Diagnosis not present

## 2022-12-10 DIAGNOSIS — M79675 Pain in left toe(s): Secondary | ICD-10-CM | POA: Diagnosis not present

## 2022-12-10 DIAGNOSIS — E1140 Type 2 diabetes mellitus with diabetic neuropathy, unspecified: Secondary | ICD-10-CM | POA: Diagnosis not present

## 2022-12-10 DIAGNOSIS — I739 Peripheral vascular disease, unspecified: Secondary | ICD-10-CM | POA: Diagnosis not present

## 2022-12-10 DIAGNOSIS — M79671 Pain in right foot: Secondary | ICD-10-CM | POA: Diagnosis not present

## 2022-12-17 DIAGNOSIS — Z0000 Encounter for general adult medical examination without abnormal findings: Secondary | ICD-10-CM | POA: Diagnosis not present

## 2022-12-20 DIAGNOSIS — W19XXXA Unspecified fall, initial encounter: Secondary | ICD-10-CM | POA: Diagnosis not present

## 2022-12-20 DIAGNOSIS — Z743 Need for continuous supervision: Secondary | ICD-10-CM | POA: Diagnosis not present

## 2022-12-20 DIAGNOSIS — G20A1 Parkinson's disease without dyskinesia, without mention of fluctuations: Secondary | ICD-10-CM | POA: Diagnosis not present

## 2022-12-20 DIAGNOSIS — Z043 Encounter for examination and observation following other accident: Secondary | ICD-10-CM | POA: Diagnosis not present

## 2022-12-20 DIAGNOSIS — S0990XA Unspecified injury of head, initial encounter: Secondary | ICD-10-CM | POA: Diagnosis not present

## 2022-12-20 DIAGNOSIS — I10 Essential (primary) hypertension: Secondary | ICD-10-CM | POA: Diagnosis not present

## 2022-12-20 DIAGNOSIS — W07XXXA Fall from chair, initial encounter: Secondary | ICD-10-CM | POA: Diagnosis not present

## 2022-12-20 DIAGNOSIS — Z7984 Long term (current) use of oral hypoglycemic drugs: Secondary | ICD-10-CM | POA: Diagnosis not present

## 2022-12-20 DIAGNOSIS — I2510 Atherosclerotic heart disease of native coronary artery without angina pectoris: Secondary | ICD-10-CM | POA: Diagnosis not present

## 2022-12-20 DIAGNOSIS — Z7902 Long term (current) use of antithrombotics/antiplatelets: Secondary | ICD-10-CM | POA: Diagnosis not present

## 2022-12-20 DIAGNOSIS — E119 Type 2 diabetes mellitus without complications: Secondary | ICD-10-CM | POA: Diagnosis not present

## 2022-12-20 DIAGNOSIS — Z8673 Personal history of transient ischemic attack (TIA), and cerebral infarction without residual deficits: Secondary | ICD-10-CM | POA: Diagnosis not present

## 2022-12-20 DIAGNOSIS — Z951 Presence of aortocoronary bypass graft: Secondary | ICD-10-CM | POA: Diagnosis not present

## 2022-12-20 DIAGNOSIS — R6889 Other general symptoms and signs: Secondary | ICD-10-CM | POA: Diagnosis not present

## 2022-12-26 DIAGNOSIS — Z23 Encounter for immunization: Secondary | ICD-10-CM | POA: Diagnosis not present

## 2022-12-26 DIAGNOSIS — N3281 Overactive bladder: Secondary | ICD-10-CM | POA: Diagnosis not present

## 2022-12-26 DIAGNOSIS — I10 Essential (primary) hypertension: Secondary | ICD-10-CM | POA: Diagnosis not present

## 2022-12-26 DIAGNOSIS — E1165 Type 2 diabetes mellitus with hyperglycemia: Secondary | ICD-10-CM | POA: Diagnosis not present

## 2022-12-26 DIAGNOSIS — S0990XA Unspecified injury of head, initial encounter: Secondary | ICD-10-CM | POA: Diagnosis not present

## 2022-12-26 DIAGNOSIS — I2510 Atherosclerotic heart disease of native coronary artery without angina pectoris: Secondary | ICD-10-CM | POA: Diagnosis not present

## 2022-12-26 DIAGNOSIS — Z0001 Encounter for general adult medical examination with abnormal findings: Secondary | ICD-10-CM | POA: Diagnosis not present

## 2022-12-26 DIAGNOSIS — H3530 Unspecified macular degeneration: Secondary | ICD-10-CM | POA: Diagnosis not present

## 2022-12-26 DIAGNOSIS — Z0000 Encounter for general adult medical examination without abnormal findings: Secondary | ICD-10-CM | POA: Diagnosis not present

## 2022-12-26 DIAGNOSIS — G20A1 Parkinson's disease without dyskinesia, without mention of fluctuations: Secondary | ICD-10-CM | POA: Diagnosis not present

## 2022-12-26 DIAGNOSIS — R634 Abnormal weight loss: Secondary | ICD-10-CM | POA: Diagnosis not present

## 2022-12-26 DIAGNOSIS — E782 Mixed hyperlipidemia: Secondary | ICD-10-CM | POA: Diagnosis not present

## 2022-12-30 DIAGNOSIS — E782 Mixed hyperlipidemia: Secondary | ICD-10-CM | POA: Diagnosis not present

## 2022-12-30 DIAGNOSIS — Z556 Problems related to health literacy: Secondary | ICD-10-CM | POA: Diagnosis not present

## 2022-12-30 DIAGNOSIS — Z9181 History of falling: Secondary | ICD-10-CM | POA: Diagnosis not present

## 2022-12-30 DIAGNOSIS — R634 Abnormal weight loss: Secondary | ICD-10-CM | POA: Diagnosis not present

## 2022-12-30 DIAGNOSIS — H3530 Unspecified macular degeneration: Secondary | ICD-10-CM | POA: Diagnosis not present

## 2022-12-30 DIAGNOSIS — S0990XD Unspecified injury of head, subsequent encounter: Secondary | ICD-10-CM | POA: Diagnosis not present

## 2022-12-30 DIAGNOSIS — I10 Essential (primary) hypertension: Secondary | ICD-10-CM | POA: Diagnosis not present

## 2022-12-30 DIAGNOSIS — E1165 Type 2 diabetes mellitus with hyperglycemia: Secondary | ICD-10-CM | POA: Diagnosis not present

## 2022-12-30 DIAGNOSIS — G20A1 Parkinson's disease without dyskinesia, without mention of fluctuations: Secondary | ICD-10-CM | POA: Diagnosis not present

## 2022-12-30 DIAGNOSIS — Z7902 Long term (current) use of antithrombotics/antiplatelets: Secondary | ICD-10-CM | POA: Diagnosis not present

## 2022-12-30 DIAGNOSIS — Z7984 Long term (current) use of oral hypoglycemic drugs: Secondary | ICD-10-CM | POA: Diagnosis not present

## 2022-12-30 DIAGNOSIS — I2510 Atherosclerotic heart disease of native coronary artery without angina pectoris: Secondary | ICD-10-CM | POA: Diagnosis not present

## 2022-12-30 DIAGNOSIS — Z8673 Personal history of transient ischemic attack (TIA), and cerebral infarction without residual deficits: Secondary | ICD-10-CM | POA: Diagnosis not present

## 2022-12-30 DIAGNOSIS — Z6824 Body mass index (BMI) 24.0-24.9, adult: Secondary | ICD-10-CM | POA: Diagnosis not present

## 2022-12-30 DIAGNOSIS — N3281 Overactive bladder: Secondary | ICD-10-CM | POA: Diagnosis not present

## 2023-01-01 DIAGNOSIS — Z556 Problems related to health literacy: Secondary | ICD-10-CM | POA: Diagnosis not present

## 2023-01-01 DIAGNOSIS — I10 Essential (primary) hypertension: Secondary | ICD-10-CM | POA: Diagnosis not present

## 2023-01-01 DIAGNOSIS — H3530 Unspecified macular degeneration: Secondary | ICD-10-CM | POA: Diagnosis not present

## 2023-01-01 DIAGNOSIS — Z9181 History of falling: Secondary | ICD-10-CM | POA: Diagnosis not present

## 2023-01-01 DIAGNOSIS — E1165 Type 2 diabetes mellitus with hyperglycemia: Secondary | ICD-10-CM | POA: Diagnosis not present

## 2023-01-01 DIAGNOSIS — Z6824 Body mass index (BMI) 24.0-24.9, adult: Secondary | ICD-10-CM | POA: Diagnosis not present

## 2023-01-01 DIAGNOSIS — Z8673 Personal history of transient ischemic attack (TIA), and cerebral infarction without residual deficits: Secondary | ICD-10-CM | POA: Diagnosis not present

## 2023-01-01 DIAGNOSIS — Z7902 Long term (current) use of antithrombotics/antiplatelets: Secondary | ICD-10-CM | POA: Diagnosis not present

## 2023-01-01 DIAGNOSIS — R634 Abnormal weight loss: Secondary | ICD-10-CM | POA: Diagnosis not present

## 2023-01-01 DIAGNOSIS — S0990XD Unspecified injury of head, subsequent encounter: Secondary | ICD-10-CM | POA: Diagnosis not present

## 2023-01-01 DIAGNOSIS — Z7984 Long term (current) use of oral hypoglycemic drugs: Secondary | ICD-10-CM | POA: Diagnosis not present

## 2023-01-01 DIAGNOSIS — N3281 Overactive bladder: Secondary | ICD-10-CM | POA: Diagnosis not present

## 2023-01-01 DIAGNOSIS — I2510 Atherosclerotic heart disease of native coronary artery without angina pectoris: Secondary | ICD-10-CM | POA: Diagnosis not present

## 2023-01-01 DIAGNOSIS — E782 Mixed hyperlipidemia: Secondary | ICD-10-CM | POA: Diagnosis not present

## 2023-01-01 DIAGNOSIS — G20A1 Parkinson's disease without dyskinesia, without mention of fluctuations: Secondary | ICD-10-CM | POA: Diagnosis not present

## 2023-01-04 DIAGNOSIS — Z7902 Long term (current) use of antithrombotics/antiplatelets: Secondary | ICD-10-CM | POA: Diagnosis not present

## 2023-01-04 DIAGNOSIS — R634 Abnormal weight loss: Secondary | ICD-10-CM | POA: Diagnosis not present

## 2023-01-04 DIAGNOSIS — Z556 Problems related to health literacy: Secondary | ICD-10-CM | POA: Diagnosis not present

## 2023-01-04 DIAGNOSIS — Z8673 Personal history of transient ischemic attack (TIA), and cerebral infarction without residual deficits: Secondary | ICD-10-CM | POA: Diagnosis not present

## 2023-01-04 DIAGNOSIS — Z7984 Long term (current) use of oral hypoglycemic drugs: Secondary | ICD-10-CM | POA: Diagnosis not present

## 2023-01-04 DIAGNOSIS — S0990XD Unspecified injury of head, subsequent encounter: Secondary | ICD-10-CM | POA: Diagnosis not present

## 2023-01-04 DIAGNOSIS — Z6824 Body mass index (BMI) 24.0-24.9, adult: Secondary | ICD-10-CM | POA: Diagnosis not present

## 2023-01-04 DIAGNOSIS — E782 Mixed hyperlipidemia: Secondary | ICD-10-CM | POA: Diagnosis not present

## 2023-01-04 DIAGNOSIS — I10 Essential (primary) hypertension: Secondary | ICD-10-CM | POA: Diagnosis not present

## 2023-01-04 DIAGNOSIS — N3281 Overactive bladder: Secondary | ICD-10-CM | POA: Diagnosis not present

## 2023-01-04 DIAGNOSIS — Z9181 History of falling: Secondary | ICD-10-CM | POA: Diagnosis not present

## 2023-01-04 DIAGNOSIS — G20A1 Parkinson's disease without dyskinesia, without mention of fluctuations: Secondary | ICD-10-CM | POA: Diagnosis not present

## 2023-01-04 DIAGNOSIS — I2510 Atherosclerotic heart disease of native coronary artery without angina pectoris: Secondary | ICD-10-CM | POA: Diagnosis not present

## 2023-01-04 DIAGNOSIS — E1165 Type 2 diabetes mellitus with hyperglycemia: Secondary | ICD-10-CM | POA: Diagnosis not present

## 2023-01-04 DIAGNOSIS — H3530 Unspecified macular degeneration: Secondary | ICD-10-CM | POA: Diagnosis not present

## 2023-01-05 DIAGNOSIS — E782 Mixed hyperlipidemia: Secondary | ICD-10-CM | POA: Diagnosis not present

## 2023-01-05 DIAGNOSIS — S0990XD Unspecified injury of head, subsequent encounter: Secondary | ICD-10-CM | POA: Diagnosis not present

## 2023-01-05 DIAGNOSIS — Z7902 Long term (current) use of antithrombotics/antiplatelets: Secondary | ICD-10-CM | POA: Diagnosis not present

## 2023-01-05 DIAGNOSIS — Z6824 Body mass index (BMI) 24.0-24.9, adult: Secondary | ICD-10-CM | POA: Diagnosis not present

## 2023-01-05 DIAGNOSIS — E1165 Type 2 diabetes mellitus with hyperglycemia: Secondary | ICD-10-CM | POA: Diagnosis not present

## 2023-01-05 DIAGNOSIS — Z8673 Personal history of transient ischemic attack (TIA), and cerebral infarction without residual deficits: Secondary | ICD-10-CM | POA: Diagnosis not present

## 2023-01-05 DIAGNOSIS — Z7984 Long term (current) use of oral hypoglycemic drugs: Secondary | ICD-10-CM | POA: Diagnosis not present

## 2023-01-05 DIAGNOSIS — G20A1 Parkinson's disease without dyskinesia, without mention of fluctuations: Secondary | ICD-10-CM | POA: Diagnosis not present

## 2023-01-05 DIAGNOSIS — H3530 Unspecified macular degeneration: Secondary | ICD-10-CM | POA: Diagnosis not present

## 2023-01-05 DIAGNOSIS — N3281 Overactive bladder: Secondary | ICD-10-CM | POA: Diagnosis not present

## 2023-01-05 DIAGNOSIS — I2510 Atherosclerotic heart disease of native coronary artery without angina pectoris: Secondary | ICD-10-CM | POA: Diagnosis not present

## 2023-01-05 DIAGNOSIS — Z556 Problems related to health literacy: Secondary | ICD-10-CM | POA: Diagnosis not present

## 2023-01-05 DIAGNOSIS — I10 Essential (primary) hypertension: Secondary | ICD-10-CM | POA: Diagnosis not present

## 2023-01-05 DIAGNOSIS — Z9181 History of falling: Secondary | ICD-10-CM | POA: Diagnosis not present

## 2023-01-05 DIAGNOSIS — R634 Abnormal weight loss: Secondary | ICD-10-CM | POA: Diagnosis not present

## 2023-01-07 DIAGNOSIS — H353231 Exudative age-related macular degeneration, bilateral, with active choroidal neovascularization: Secondary | ICD-10-CM | POA: Diagnosis not present

## 2023-01-07 DIAGNOSIS — H35372 Puckering of macula, left eye: Secondary | ICD-10-CM | POA: Diagnosis not present

## 2023-01-07 DIAGNOSIS — D3131 Benign neoplasm of right choroid: Secondary | ICD-10-CM | POA: Diagnosis not present

## 2023-01-07 DIAGNOSIS — H43813 Vitreous degeneration, bilateral: Secondary | ICD-10-CM | POA: Diagnosis not present

## 2023-01-08 DIAGNOSIS — Z556 Problems related to health literacy: Secondary | ICD-10-CM | POA: Diagnosis not present

## 2023-01-08 DIAGNOSIS — I2510 Atherosclerotic heart disease of native coronary artery without angina pectoris: Secondary | ICD-10-CM | POA: Diagnosis not present

## 2023-01-08 DIAGNOSIS — R634 Abnormal weight loss: Secondary | ICD-10-CM | POA: Diagnosis not present

## 2023-01-08 DIAGNOSIS — N3281 Overactive bladder: Secondary | ICD-10-CM | POA: Diagnosis not present

## 2023-01-08 DIAGNOSIS — I10 Essential (primary) hypertension: Secondary | ICD-10-CM | POA: Diagnosis not present

## 2023-01-08 DIAGNOSIS — H3530 Unspecified macular degeneration: Secondary | ICD-10-CM | POA: Diagnosis not present

## 2023-01-08 DIAGNOSIS — Z9181 History of falling: Secondary | ICD-10-CM | POA: Diagnosis not present

## 2023-01-08 DIAGNOSIS — G20A1 Parkinson's disease without dyskinesia, without mention of fluctuations: Secondary | ICD-10-CM | POA: Diagnosis not present

## 2023-01-08 DIAGNOSIS — E782 Mixed hyperlipidemia: Secondary | ICD-10-CM | POA: Diagnosis not present

## 2023-01-08 DIAGNOSIS — Z7984 Long term (current) use of oral hypoglycemic drugs: Secondary | ICD-10-CM | POA: Diagnosis not present

## 2023-01-08 DIAGNOSIS — Z7902 Long term (current) use of antithrombotics/antiplatelets: Secondary | ICD-10-CM | POA: Diagnosis not present

## 2023-01-08 DIAGNOSIS — S0990XD Unspecified injury of head, subsequent encounter: Secondary | ICD-10-CM | POA: Diagnosis not present

## 2023-01-08 DIAGNOSIS — Z8673 Personal history of transient ischemic attack (TIA), and cerebral infarction without residual deficits: Secondary | ICD-10-CM | POA: Diagnosis not present

## 2023-01-08 DIAGNOSIS — E1165 Type 2 diabetes mellitus with hyperglycemia: Secondary | ICD-10-CM | POA: Diagnosis not present

## 2023-01-08 DIAGNOSIS — Z6824 Body mass index (BMI) 24.0-24.9, adult: Secondary | ICD-10-CM | POA: Diagnosis not present

## 2023-01-11 ENCOUNTER — Telehealth: Payer: Self-pay | Admitting: *Deleted

## 2023-01-11 DIAGNOSIS — Z8673 Personal history of transient ischemic attack (TIA), and cerebral infarction without residual deficits: Secondary | ICD-10-CM | POA: Diagnosis not present

## 2023-01-11 DIAGNOSIS — Z7902 Long term (current) use of antithrombotics/antiplatelets: Secondary | ICD-10-CM | POA: Diagnosis not present

## 2023-01-11 DIAGNOSIS — S0990XD Unspecified injury of head, subsequent encounter: Secondary | ICD-10-CM | POA: Diagnosis not present

## 2023-01-11 DIAGNOSIS — Z556 Problems related to health literacy: Secondary | ICD-10-CM | POA: Diagnosis not present

## 2023-01-11 DIAGNOSIS — N3281 Overactive bladder: Secondary | ICD-10-CM | POA: Diagnosis not present

## 2023-01-11 DIAGNOSIS — Z6824 Body mass index (BMI) 24.0-24.9, adult: Secondary | ICD-10-CM | POA: Diagnosis not present

## 2023-01-11 DIAGNOSIS — I10 Essential (primary) hypertension: Secondary | ICD-10-CM | POA: Diagnosis not present

## 2023-01-11 DIAGNOSIS — E782 Mixed hyperlipidemia: Secondary | ICD-10-CM | POA: Diagnosis not present

## 2023-01-11 DIAGNOSIS — H3530 Unspecified macular degeneration: Secondary | ICD-10-CM | POA: Diagnosis not present

## 2023-01-11 DIAGNOSIS — Z9181 History of falling: Secondary | ICD-10-CM | POA: Diagnosis not present

## 2023-01-11 DIAGNOSIS — I2510 Atherosclerotic heart disease of native coronary artery without angina pectoris: Secondary | ICD-10-CM | POA: Diagnosis not present

## 2023-01-11 DIAGNOSIS — R634 Abnormal weight loss: Secondary | ICD-10-CM | POA: Diagnosis not present

## 2023-01-11 DIAGNOSIS — E1165 Type 2 diabetes mellitus with hyperglycemia: Secondary | ICD-10-CM | POA: Diagnosis not present

## 2023-01-11 DIAGNOSIS — Z7984 Long term (current) use of oral hypoglycemic drugs: Secondary | ICD-10-CM | POA: Diagnosis not present

## 2023-01-11 DIAGNOSIS — G20A1 Parkinson's disease without dyskinesia, without mention of fluctuations: Secondary | ICD-10-CM | POA: Diagnosis not present

## 2023-01-11 MED ORDER — EZETIMIBE 10 MG PO TABS: 10.0000 mg | ORAL_TABLET | Freq: Every day | ORAL | 3 refills | Status: DC

## 2023-01-11 NOTE — Telephone Encounter (Signed)
-----   Message from Marjo Bicker, MD sent at 01/07/2023 11:52 AM EDT ----- Lipid panel reviewed from 11/2022, LDL was noted to be 85.  Goal LDL should be less than 70.  He is currently on simvastatin 20 mg nightly which I will continue due to prior history of fatigue with statins.  Will add Zetia 10 mg once daily (if patient is agreeable).

## 2023-01-11 NOTE — Telephone Encounter (Signed)
Patient informed and agrees to plan.

## 2023-01-12 DIAGNOSIS — Z556 Problems related to health literacy: Secondary | ICD-10-CM | POA: Diagnosis not present

## 2023-01-12 DIAGNOSIS — Z7902 Long term (current) use of antithrombotics/antiplatelets: Secondary | ICD-10-CM | POA: Diagnosis not present

## 2023-01-12 DIAGNOSIS — R634 Abnormal weight loss: Secondary | ICD-10-CM | POA: Diagnosis not present

## 2023-01-12 DIAGNOSIS — E782 Mixed hyperlipidemia: Secondary | ICD-10-CM | POA: Diagnosis not present

## 2023-01-12 DIAGNOSIS — Z6824 Body mass index (BMI) 24.0-24.9, adult: Secondary | ICD-10-CM | POA: Diagnosis not present

## 2023-01-12 DIAGNOSIS — Z7984 Long term (current) use of oral hypoglycemic drugs: Secondary | ICD-10-CM | POA: Diagnosis not present

## 2023-01-12 DIAGNOSIS — S0990XD Unspecified injury of head, subsequent encounter: Secondary | ICD-10-CM | POA: Diagnosis not present

## 2023-01-12 DIAGNOSIS — G20A1 Parkinson's disease without dyskinesia, without mention of fluctuations: Secondary | ICD-10-CM | POA: Diagnosis not present

## 2023-01-12 DIAGNOSIS — H3530 Unspecified macular degeneration: Secondary | ICD-10-CM | POA: Diagnosis not present

## 2023-01-12 DIAGNOSIS — N3281 Overactive bladder: Secondary | ICD-10-CM | POA: Diagnosis not present

## 2023-01-12 DIAGNOSIS — I2510 Atherosclerotic heart disease of native coronary artery without angina pectoris: Secondary | ICD-10-CM | POA: Diagnosis not present

## 2023-01-12 DIAGNOSIS — I10 Essential (primary) hypertension: Secondary | ICD-10-CM | POA: Diagnosis not present

## 2023-01-12 DIAGNOSIS — Z9181 History of falling: Secondary | ICD-10-CM | POA: Diagnosis not present

## 2023-01-12 DIAGNOSIS — Z8673 Personal history of transient ischemic attack (TIA), and cerebral infarction without residual deficits: Secondary | ICD-10-CM | POA: Diagnosis not present

## 2023-01-12 DIAGNOSIS — E1165 Type 2 diabetes mellitus with hyperglycemia: Secondary | ICD-10-CM | POA: Diagnosis not present

## 2023-01-14 DIAGNOSIS — H3530 Unspecified macular degeneration: Secondary | ICD-10-CM | POA: Diagnosis not present

## 2023-01-14 DIAGNOSIS — Z9181 History of falling: Secondary | ICD-10-CM | POA: Diagnosis not present

## 2023-01-14 DIAGNOSIS — G20A1 Parkinson's disease without dyskinesia, without mention of fluctuations: Secondary | ICD-10-CM | POA: Diagnosis not present

## 2023-01-14 DIAGNOSIS — Z556 Problems related to health literacy: Secondary | ICD-10-CM | POA: Diagnosis not present

## 2023-01-14 DIAGNOSIS — N3281 Overactive bladder: Secondary | ICD-10-CM | POA: Diagnosis not present

## 2023-01-14 DIAGNOSIS — I2510 Atherosclerotic heart disease of native coronary artery without angina pectoris: Secondary | ICD-10-CM | POA: Diagnosis not present

## 2023-01-14 DIAGNOSIS — I10 Essential (primary) hypertension: Secondary | ICD-10-CM | POA: Diagnosis not present

## 2023-01-14 DIAGNOSIS — S0990XD Unspecified injury of head, subsequent encounter: Secondary | ICD-10-CM | POA: Diagnosis not present

## 2023-01-14 DIAGNOSIS — Z8673 Personal history of transient ischemic attack (TIA), and cerebral infarction without residual deficits: Secondary | ICD-10-CM | POA: Diagnosis not present

## 2023-01-14 DIAGNOSIS — E782 Mixed hyperlipidemia: Secondary | ICD-10-CM | POA: Diagnosis not present

## 2023-01-14 DIAGNOSIS — Z6824 Body mass index (BMI) 24.0-24.9, adult: Secondary | ICD-10-CM | POA: Diagnosis not present

## 2023-01-14 DIAGNOSIS — Z7984 Long term (current) use of oral hypoglycemic drugs: Secondary | ICD-10-CM | POA: Diagnosis not present

## 2023-01-14 DIAGNOSIS — Z7902 Long term (current) use of antithrombotics/antiplatelets: Secondary | ICD-10-CM | POA: Diagnosis not present

## 2023-01-14 DIAGNOSIS — E1165 Type 2 diabetes mellitus with hyperglycemia: Secondary | ICD-10-CM | POA: Diagnosis not present

## 2023-01-14 DIAGNOSIS — R634 Abnormal weight loss: Secondary | ICD-10-CM | POA: Diagnosis not present

## 2023-01-18 DIAGNOSIS — S0990XD Unspecified injury of head, subsequent encounter: Secondary | ICD-10-CM | POA: Diagnosis not present

## 2023-01-18 DIAGNOSIS — Z7984 Long term (current) use of oral hypoglycemic drugs: Secondary | ICD-10-CM | POA: Diagnosis not present

## 2023-01-18 DIAGNOSIS — Z6824 Body mass index (BMI) 24.0-24.9, adult: Secondary | ICD-10-CM | POA: Diagnosis not present

## 2023-01-18 DIAGNOSIS — R634 Abnormal weight loss: Secondary | ICD-10-CM | POA: Diagnosis not present

## 2023-01-18 DIAGNOSIS — Z7902 Long term (current) use of antithrombotics/antiplatelets: Secondary | ICD-10-CM | POA: Diagnosis not present

## 2023-01-18 DIAGNOSIS — Z556 Problems related to health literacy: Secondary | ICD-10-CM | POA: Diagnosis not present

## 2023-01-18 DIAGNOSIS — G20A1 Parkinson's disease without dyskinesia, without mention of fluctuations: Secondary | ICD-10-CM | POA: Diagnosis not present

## 2023-01-18 DIAGNOSIS — E782 Mixed hyperlipidemia: Secondary | ICD-10-CM | POA: Diagnosis not present

## 2023-01-18 DIAGNOSIS — I2510 Atherosclerotic heart disease of native coronary artery without angina pectoris: Secondary | ICD-10-CM | POA: Diagnosis not present

## 2023-01-18 DIAGNOSIS — E1165 Type 2 diabetes mellitus with hyperglycemia: Secondary | ICD-10-CM | POA: Diagnosis not present

## 2023-01-18 DIAGNOSIS — N3281 Overactive bladder: Secondary | ICD-10-CM | POA: Diagnosis not present

## 2023-01-18 DIAGNOSIS — I10 Essential (primary) hypertension: Secondary | ICD-10-CM | POA: Diagnosis not present

## 2023-01-18 DIAGNOSIS — Z8673 Personal history of transient ischemic attack (TIA), and cerebral infarction without residual deficits: Secondary | ICD-10-CM | POA: Diagnosis not present

## 2023-01-18 DIAGNOSIS — H3530 Unspecified macular degeneration: Secondary | ICD-10-CM | POA: Diagnosis not present

## 2023-01-18 DIAGNOSIS — Z9181 History of falling: Secondary | ICD-10-CM | POA: Diagnosis not present

## 2023-01-19 DIAGNOSIS — Z7984 Long term (current) use of oral hypoglycemic drugs: Secondary | ICD-10-CM | POA: Diagnosis not present

## 2023-01-19 DIAGNOSIS — G20A1 Parkinson's disease without dyskinesia, without mention of fluctuations: Secondary | ICD-10-CM | POA: Diagnosis not present

## 2023-01-19 DIAGNOSIS — S0990XD Unspecified injury of head, subsequent encounter: Secondary | ICD-10-CM | POA: Diagnosis not present

## 2023-01-19 DIAGNOSIS — E1165 Type 2 diabetes mellitus with hyperglycemia: Secondary | ICD-10-CM | POA: Diagnosis not present

## 2023-01-19 DIAGNOSIS — Z9181 History of falling: Secondary | ICD-10-CM | POA: Diagnosis not present

## 2023-01-19 DIAGNOSIS — Z556 Problems related to health literacy: Secondary | ICD-10-CM | POA: Diagnosis not present

## 2023-01-19 DIAGNOSIS — Z7902 Long term (current) use of antithrombotics/antiplatelets: Secondary | ICD-10-CM | POA: Diagnosis not present

## 2023-01-19 DIAGNOSIS — I10 Essential (primary) hypertension: Secondary | ICD-10-CM | POA: Diagnosis not present

## 2023-01-19 DIAGNOSIS — H3530 Unspecified macular degeneration: Secondary | ICD-10-CM | POA: Diagnosis not present

## 2023-01-19 DIAGNOSIS — Z8673 Personal history of transient ischemic attack (TIA), and cerebral infarction without residual deficits: Secondary | ICD-10-CM | POA: Diagnosis not present

## 2023-01-19 DIAGNOSIS — N3281 Overactive bladder: Secondary | ICD-10-CM | POA: Diagnosis not present

## 2023-01-19 DIAGNOSIS — R634 Abnormal weight loss: Secondary | ICD-10-CM | POA: Diagnosis not present

## 2023-01-19 DIAGNOSIS — I2510 Atherosclerotic heart disease of native coronary artery without angina pectoris: Secondary | ICD-10-CM | POA: Diagnosis not present

## 2023-01-19 DIAGNOSIS — Z6824 Body mass index (BMI) 24.0-24.9, adult: Secondary | ICD-10-CM | POA: Diagnosis not present

## 2023-01-19 DIAGNOSIS — E782 Mixed hyperlipidemia: Secondary | ICD-10-CM | POA: Diagnosis not present

## 2023-01-27 DIAGNOSIS — Z7902 Long term (current) use of antithrombotics/antiplatelets: Secondary | ICD-10-CM | POA: Diagnosis not present

## 2023-01-27 DIAGNOSIS — I2510 Atherosclerotic heart disease of native coronary artery without angina pectoris: Secondary | ICD-10-CM | POA: Diagnosis not present

## 2023-01-27 DIAGNOSIS — Z8673 Personal history of transient ischemic attack (TIA), and cerebral infarction without residual deficits: Secondary | ICD-10-CM | POA: Diagnosis not present

## 2023-01-27 DIAGNOSIS — Z9181 History of falling: Secondary | ICD-10-CM | POA: Diagnosis not present

## 2023-01-27 DIAGNOSIS — Z556 Problems related to health literacy: Secondary | ICD-10-CM | POA: Diagnosis not present

## 2023-01-27 DIAGNOSIS — E1165 Type 2 diabetes mellitus with hyperglycemia: Secondary | ICD-10-CM | POA: Diagnosis not present

## 2023-01-27 DIAGNOSIS — N3281 Overactive bladder: Secondary | ICD-10-CM | POA: Diagnosis not present

## 2023-01-27 DIAGNOSIS — Z7984 Long term (current) use of oral hypoglycemic drugs: Secondary | ICD-10-CM | POA: Diagnosis not present

## 2023-01-27 DIAGNOSIS — E782 Mixed hyperlipidemia: Secondary | ICD-10-CM | POA: Diagnosis not present

## 2023-01-27 DIAGNOSIS — G20A1 Parkinson's disease without dyskinesia, without mention of fluctuations: Secondary | ICD-10-CM | POA: Diagnosis not present

## 2023-01-27 DIAGNOSIS — S0990XD Unspecified injury of head, subsequent encounter: Secondary | ICD-10-CM | POA: Diagnosis not present

## 2023-01-27 DIAGNOSIS — H3530 Unspecified macular degeneration: Secondary | ICD-10-CM | POA: Diagnosis not present

## 2023-01-27 DIAGNOSIS — Z6824 Body mass index (BMI) 24.0-24.9, adult: Secondary | ICD-10-CM | POA: Diagnosis not present

## 2023-01-27 DIAGNOSIS — I10 Essential (primary) hypertension: Secondary | ICD-10-CM | POA: Diagnosis not present

## 2023-01-27 DIAGNOSIS — R634 Abnormal weight loss: Secondary | ICD-10-CM | POA: Diagnosis not present

## 2023-02-02 DIAGNOSIS — E1165 Type 2 diabetes mellitus with hyperglycemia: Secondary | ICD-10-CM | POA: Diagnosis not present

## 2023-02-02 DIAGNOSIS — H3530 Unspecified macular degeneration: Secondary | ICD-10-CM | POA: Diagnosis not present

## 2023-02-02 DIAGNOSIS — Z7902 Long term (current) use of antithrombotics/antiplatelets: Secondary | ICD-10-CM | POA: Diagnosis not present

## 2023-02-02 DIAGNOSIS — I2510 Atherosclerotic heart disease of native coronary artery without angina pectoris: Secondary | ICD-10-CM | POA: Diagnosis not present

## 2023-02-02 DIAGNOSIS — Z7984 Long term (current) use of oral hypoglycemic drugs: Secondary | ICD-10-CM | POA: Diagnosis not present

## 2023-02-02 DIAGNOSIS — R634 Abnormal weight loss: Secondary | ICD-10-CM | POA: Diagnosis not present

## 2023-02-02 DIAGNOSIS — I10 Essential (primary) hypertension: Secondary | ICD-10-CM | POA: Diagnosis not present

## 2023-02-02 DIAGNOSIS — N3281 Overactive bladder: Secondary | ICD-10-CM | POA: Diagnosis not present

## 2023-02-02 DIAGNOSIS — Z8673 Personal history of transient ischemic attack (TIA), and cerebral infarction without residual deficits: Secondary | ICD-10-CM | POA: Diagnosis not present

## 2023-02-02 DIAGNOSIS — Z9181 History of falling: Secondary | ICD-10-CM | POA: Diagnosis not present

## 2023-02-02 DIAGNOSIS — S0990XD Unspecified injury of head, subsequent encounter: Secondary | ICD-10-CM | POA: Diagnosis not present

## 2023-02-02 DIAGNOSIS — Z6824 Body mass index (BMI) 24.0-24.9, adult: Secondary | ICD-10-CM | POA: Diagnosis not present

## 2023-02-02 DIAGNOSIS — E782 Mixed hyperlipidemia: Secondary | ICD-10-CM | POA: Diagnosis not present

## 2023-02-02 DIAGNOSIS — Z556 Problems related to health literacy: Secondary | ICD-10-CM | POA: Diagnosis not present

## 2023-02-02 DIAGNOSIS — G20A1 Parkinson's disease without dyskinesia, without mention of fluctuations: Secondary | ICD-10-CM | POA: Diagnosis not present

## 2023-02-18 DIAGNOSIS — M79674 Pain in right toe(s): Secondary | ICD-10-CM | POA: Diagnosis not present

## 2023-02-18 DIAGNOSIS — I739 Peripheral vascular disease, unspecified: Secondary | ICD-10-CM | POA: Diagnosis not present

## 2023-02-18 DIAGNOSIS — E1140 Type 2 diabetes mellitus with diabetic neuropathy, unspecified: Secondary | ICD-10-CM | POA: Diagnosis not present

## 2023-02-18 DIAGNOSIS — M79672 Pain in left foot: Secondary | ICD-10-CM | POA: Diagnosis not present

## 2023-02-18 DIAGNOSIS — M79675 Pain in left toe(s): Secondary | ICD-10-CM | POA: Diagnosis not present

## 2023-02-18 DIAGNOSIS — M79671 Pain in right foot: Secondary | ICD-10-CM | POA: Diagnosis not present

## 2023-02-18 DIAGNOSIS — L110 Acquired keratosis follicularis: Secondary | ICD-10-CM | POA: Diagnosis not present

## 2023-03-04 DIAGNOSIS — H35372 Puckering of macula, left eye: Secondary | ICD-10-CM | POA: Diagnosis not present

## 2023-03-04 DIAGNOSIS — H43813 Vitreous degeneration, bilateral: Secondary | ICD-10-CM | POA: Diagnosis not present

## 2023-03-04 DIAGNOSIS — D3131 Benign neoplasm of right choroid: Secondary | ICD-10-CM | POA: Diagnosis not present

## 2023-03-04 DIAGNOSIS — H353231 Exudative age-related macular degeneration, bilateral, with active choroidal neovascularization: Secondary | ICD-10-CM | POA: Diagnosis not present

## 2023-03-19 DIAGNOSIS — R1310 Dysphagia, unspecified: Secondary | ICD-10-CM | POA: Diagnosis not present

## 2023-04-06 ENCOUNTER — Ambulatory Visit (INDEPENDENT_AMBULATORY_CARE_PROVIDER_SITE_OTHER): Payer: Medicare Other | Admitting: Gastroenterology

## 2023-04-08 ENCOUNTER — Ambulatory Visit (INDEPENDENT_AMBULATORY_CARE_PROVIDER_SITE_OTHER): Payer: Medicare Other | Admitting: Gastroenterology

## 2023-04-08 ENCOUNTER — Encounter: Payer: Self-pay | Admitting: Gastroenterology

## 2023-04-08 VITALS — BP 138/75 | HR 82 | Temp 98.0°F | Ht 69.0 in | Wt 166.8 lb

## 2023-04-08 DIAGNOSIS — Z86010 Personal history of colonic polyps: Secondary | ICD-10-CM | POA: Diagnosis not present

## 2023-04-08 DIAGNOSIS — R634 Abnormal weight loss: Secondary | ICD-10-CM

## 2023-04-08 DIAGNOSIS — R1310 Dysphagia, unspecified: Secondary | ICD-10-CM | POA: Diagnosis not present

## 2023-04-08 MED ORDER — OMEPRAZOLE 20 MG PO CPDR: 20.0000 mg | DELAYED_RELEASE_CAPSULE | Freq: Every day | ORAL | 1 refills | Status: DC

## 2023-04-08 NOTE — Progress Notes (Signed)
GI Office Note    Referring Provider: Benita Stabile, MD Primary Care Physician:  Benita Stabile, MD  Primary Gastroenterologist: Dr. Tasia Day  Chief Complaint   Chief Complaint  Patient presents with   Dysphagia    Food stops in middle of chest area. Sometimes has to vomit    History of Present Illness   [name] is a 82 y.o. male presenting today at the request of [name], Kathleene Hazel, MD for dysphagia.  Colonoscopy in 2018 Dr. Lovell Sheehan: -2 mm polyp in cecum -3 mm polyp in mid transverse -sigmoid diverticulosis -Recommended 5 years   Has had a few episodes with food getting stuck at the top of his chest. 1-2 times per week and gets hiccups and sometimes vomits with it. His wife states he complains of chest pressure when that occurs. Has a great appetite usually but he will stop eating and just sit there when it occurs. He will then resume eating at times and sometimes he will go to the bathroom and throw it up. Wife reports he has lost about 40 lbs over the last year and a half to 2 years. His weight loss is worrying his wife.   Does not believe he has ever had an upper endoscopy.   No constipation, diarrhea, melena, brbpr.     Current Outpatient Medications  Medication Sig Dispense Refill   amLODipine-benazepril (LOTREL) 5-20 MG capsule Take 1 capsule by mouth daily.     carbidopa-levodopa (SINEMET IR) 25-100 MG tablet Take 2 tablets by mouth 3 (three) times daily. 540 tablet 3   clopidogrel (PLAVIX) 75 MG tablet Take 1 tablet (75 mg total) by mouth daily. 90 tablet 4   ezetimibe (ZETIA) 10 MG tablet Take 1 tablet (10 mg total) by mouth daily. 90 tablet 3   Lancets (UNILET MICRO-THIN 33G) MISC USE TO test TWICE DAILY     metFORMIN (GLUCOPHAGE) 1000 MG tablet Take 1,000 mg by mouth 2 (two) times daily with a meal.  3   Multiple Vitamins-Minerals (PRESERVISION AREDS 2+MULTI VIT) CAPS Take 1 tablet by mouth in the morning and at bedtime.     nitroGLYCERIN (NITROSTAT) 0.4 MG SL  tablet DISSOLVE 1 TABLET UNDER THE TONGUE EVERY 5 MINUTES AS NEEDED FOR CHEST PAIN. DO NOT EXCEED A TOTAL OF 3 DOSES IN 15 MINUTES.     ONETOUCH ULTRA test strip      simvastatin (ZOCOR) 10 MG tablet Take 10 mg by mouth daily.     cetirizine (ZYRTEC) 10 MG tablet Take 10 mg by mouth daily as needed for allergies. (Patient not taking: Reported on 04/08/2023)     No current facility-administered medications for this visit.    Past Medical History:  Diagnosis Date   Abnormal gait    Fatigue    Hypertension    Prostate cancer (HCC)    Tremor    Vitamin D deficiency     Past Surgical History:  Procedure Laterality Date   CARDIAC SURGERY     bypass   COLONOSCOPY N/A 02/09/2017   Procedure: COLONOSCOPY;  Surgeon: Victor Macho, MD;  Location: [facility];  Service: Gastroenterology;  Laterality: N/A;   HEMORROIDECTOMY     KNEE SURGERY Right     Family History  Problem Relation Age of Onset   Dementia Mother    Diabetes Father    Lung cancer Father    Breast cancer Sister    Multiple myeloma Brother     Allergies as of  04/08/2023   (No Known Allergies)    Social History   Socioeconomic History   Marital status: Married    Spouse name: Not on file   Number of children: 1   Years of education: 12   Highest education level: High school graduate  Occupational History   Occupation: Retired  Tobacco Use   Smoking status: Former    Current packs/day: 0.00    Average packs/day: 1 pack/day for 9.0 years (9.0 ttl pk-yrs)    Types: Cigarettes    Start date: 02/24/1962    Quit date: 02/25/1971    Years since quitting: 52.1    Passive exposure: Never   Smokeless tobacco: Former    Types: Snuff  Vaping Use   Vaping status: Never Used  Substance and Sexual Activity   Alcohol use: No    Alcohol/week: 0.0 standard drinks of alcohol   Drug use: No   Sexual activity: Not on file  Other Topics Concern   Not on file  Social History Narrative   Lives at home with his wife.    Right-handed.   2 cups caffeine per day.   Social Determinants of Health   Financial Resource Strain: Not on file  Food Insecurity: Not on file  Transportation Needs: Not on file  Physical Activity: Not on file  Stress: Not on file  Social Connections: Not on file  Intimate Partner Violence: Not on file     Review of Systems   Gen: Denies any fever, chills, fatigue, weight loss, lack of appetite.  CV: Denies chest pain, heart palpitations, peripheral edema, syncope.  Resp: Denies shortness of breath at rest or with exertion. Denies wheezing or cough.  GI: see HPI GU : Denies urinary burning, urinary frequency, urinary hesitancy MS: Denies joint pain, muscle weakness, cramps, or limitation of movement.  Derm: Denies rash, itching, dry skin Psych: Denies depression, anxiety, memory loss, and confusion Heme: Denies bruising, bleeding, and enlarged lymph nodes.   Physical Exam   BP 138/75 (BP Location: Right Arm, Patient Position: Sitting, Cuff Size: Normal)   Pulse 82   Temp 98 F (36.7 C) (Temporal)   Ht 5\' 9"  (1.753 m)   Wt 166 lb 12.8 oz (75.7 kg)   SpO2 100%   BMI 24.63 kg/m   General:   Alert and oriented. Pleasant and cooperative. Well-nourished and well-developed.  Head:  Normocephalic and atraumatic. Eyes:  Without icterus, sclera clear and conjunctiva pink.  Ears:  Normal auditory acuity. Mouth:  No deformity or lesions, oral mucosa pink.  Lungs:  Clear to auscultation bilaterally. No wheezes, rales, or rhonchi. No distress.  Heart:  S1, S2 present without murmurs appreciated.  Abdomen:  +BS, soft, non-tender and non-distended. No HSM noted. No guarding or rebound. No masses appreciated.  Rectal:  Deferred  Msk: Decreased muscle tone.  Hunched over posture. Extremities:  Without edema. Neurologic:  Alert and  oriented x4; weak gait, uses four-wheel walker. Skin:  Intact without significant lesions or rashes. Psych:  Alert and cooperative. Normal mood and  affect.   Assessment   [name] is a 82 y.o. male with a history of Parkinson's, diabetes, HTN, stroke x2 on Plavix, prostate cancer, MI s/p CABG x3 in 1999, and vitamin D deficiency presenting today for evaluation of dysphagia/regurgitation.  Dysphagia: Patient had intermittent symptoms for quite some time however wife feels like this is getting a little bit more frequent, usually occurring about 1-2 times per week that at times is followed by  regurgitation of food and hiccups.  Denies any traditional heartburn symptoms.  Symptoms do not appear to be oropharyngeal in nature.  Discussed evaluation with EGD versus BPE and he would like to pursue as least invasive options as possible therefore we will proceed with BPE.  Currently not on any PPI or H2 blocker.  Discussed trialing low-dose PPI to see if this helps with symptoms. Discussed potential side effects and interactions of PPI with plavix and he has agreed to try medication short-term.  Weight loss: Wife reports about a 40 pound weight loss over the last 1.5-2 years and seeing more bone and skin on him is wearing her especially given that he has a great appetite.  Recent labs in March with normal CBC and unremarkable CMP.  A1c 5.9.  Last colonoscopy in 2018.  Not interested in EGD at this time as stated above.  Will complete additional serologic workup with CRP and TSH.  Advised that we could consider chest and abdominal imaging for further evaluation but given the amount of weight loss over the last 1-2 years this is not as concerning especially in the setting of prior heart attack, recent multiple strokes, and Parkinson's.  History of colon polyps: Last colonoscopy in 2018 with multiple polyps removed and recommended 5-year repeat.  Currently not interested in repeating colonoscopy.  Discussed risks of not completing colonoscopy and indicates his understanding.  PLAN   BPE Omeprazole 20 mg daily for 1 month Small bites and alternate  with sips of liquids.  Will check labs from PCP and consider additional serological work up for weigth loss.  CRP and TSH to complete work up for weight loss  Follow up in 8 weeks.    Brooke Bonito, MSN, FNP-BC, AGACNP-BC [facility]

## 2023-04-08 NOTE — Patient Instructions (Addendum)
Will be in touch to schedule you for a barium pill esophagram to further assess her swallowing issue.  Start taking omeprazole 20 mg once daily in the mornings, about 30 minutes prior to breakfast.  Try this for 1 month to see if this improves any of your swallowing/vomiting issue.  It is important for you to alternate small bites with sips of liquids, cutting out meat into very small pieces and chewing adequately prior to swallowing to avoid food impaction.  We will plan to follow-up in 8 weeks, sooner if needed.  It was a pleasure to see you today. I want to create trusting relationships with patients. If you receive a survey regarding your visit,  I greatly appreciate you taking time to fill this out on paper or through your MyChart. I value your feedback.  Brooke Bonito, MSN, FNP-BC, AGACNP-BC [facility]

## 2023-04-12 ENCOUNTER — Telehealth: Payer: Self-pay | Admitting: *Deleted

## 2023-04-12 ENCOUNTER — Encounter: Payer: Self-pay | Admitting: Gastroenterology

## 2023-04-12 NOTE — Telephone Encounter (Signed)
LMOVM to call back to let us know he did receive his appt for his BPE

## 2023-04-19 ENCOUNTER — Other Ambulatory Visit (HOSPITAL_COMMUNITY)
Admission: RE | Admit: 2023-04-19 | Discharge: 2023-04-19 | Disposition: A | Discharge status: Home / Self Care | Payer: Medicare Other | Source: Ambulatory Visit | Attending: Gastroenterology | Admitting: Gastroenterology

## 2023-04-19 ENCOUNTER — Ambulatory Visit (HOSPITAL_COMMUNITY)
Admission: RE | Admit: 2023-04-19 | Discharge: 2023-04-19 | Disposition: A | Discharge status: Home / Self Care | Payer: Medicare Other | Source: Ambulatory Visit | Attending: Gastroenterology | Admitting: Gastroenterology

## 2023-04-19 ENCOUNTER — Other Ambulatory Visit: Payer: Self-pay | Admitting: Gastroenterology

## 2023-04-19 DIAGNOSIS — R1310 Dysphagia, unspecified: Secondary | ICD-10-CM | POA: Insufficient documentation

## 2023-04-19 LAB — T4, FREE: Free T4: 1.05 ng/dL (ref 0.61–1.12)

## 2023-04-19 LAB — C-REACTIVE PROTEIN: CRP: 0.5 mg/dL (ref <1.0)

## 2023-04-19 LAB — TSH: TSH: 0.83 u[IU]/mL (ref 0.350–4.500)

## 2023-05-12 NOTE — Progress Notes (Addendum)
PRIMARY NEUROLOGIST: Dr. Terrace Arabia   ASSESSMENT AND PLAN  1.  Parkinson's disease 2.  History of stroke  -Adjust Sinemet dosing 25/100 MG 2 tablets 3 times daily at 8 AM, 12 PM, 4 OR 5 PM, if needed, we may add in a fourth dose.  However, seems his gait abnormalities are also influenced by his sedentary lifestyle, deconditioning -Recommend increasing physical activity, is very sedentary.  Completed PT home health without much benefit -Continue workup with GI, monitoring weight loss -Continue Plavix 75 mg daily for secondary stroke prevention -Follow up in 6 months with me  Addendum 07/20/23 SS: Labs from PCP 07/09/23 TSH 1.060, A1c 5.8, LDL 37, cholesterol 98, creatinine 0.83, Hgb 12.8, platelet 289.  HISTORY: DEGAN MCCRERY is a 82 year old male, seen in request by his primary care physician Dr. Nita Sells for evaluation of gait abnormality, he is accompanied by his wife at today's clinical visit on November 07, 2019.   I have reviewed and summarized the referring note from the referring physician.  He had a past medical history of hypertension, hyperlipidemia, diabetes, coronary artery disease,   He was previously very active, "very physical", enjoying his yard work in the summertime, around November 2020, he noticed gradual onset gait abnormality, fell few times, acute worsening since he fell at the end of December 2020, he fell in the bathtub on his back, following that fall, he had 2 weeks history of urinary incontinence, now gradually improved, but his gait abnormality remains, he was noted to have small stride, careful, stiff gait,   He denied loss of sense of smell, denies REM sleep disorder or memory loss, denies hand tremor, denies bilateral upper or lower extremity paresthesia, denies significant pain, he denies double vision, swallowing difficulty, ptosis,   Laboratory evaluations in January 2021, normal CBC hemoglobin of 13.9, CMP, creatinine of 0.94, LDL was 68, A1c was 7.0,    UPDATE December 21 2019: He continue complains of unsteady gait, seems to have more trouble on his right side   MRI of brain in March 2021, small subacute lacunar infarction in the right cerebellum hemisphere,also chronic microvascular ischemic changes in the right middle cerebellar peduncle, left pons, subcortical, periventricular, deep white matter's,   MRI of cervical spine showed multilevel degenerative changes, but there was no evidence of spinal cord or canal stenosis.   Laboratory evaluation February 2021, normal B12, RPR, folic acid, C-reactive protein, TSH, CPK, ESR, ANA, acetylcholine receptor antibody   UPDATE April 23 2020: Snienemt has helped his walking, reported mild to moderate improvement, taking it at 9 AM, 1 PM, 6 PM, noticed elevated 30 minutes after medication, also noticed wearing off before the next dose, he is not ambulated with a cane, physical therapy was helpful as well   He denied loss sense of smell, no REM sleep disorder, no significant side effect from Sinemet dose to try higher dose   Echocardiogram showed ejection fraction 50 to 55%, no right-to-left shunt by bubble study Ultrasound of carotid artery showed less than 39% stenosis bilaterally, is taking Plavix 75 mg only   UPDATE May 12 2021: He fell in tub, had incontinence, quit driving in Feb 1610.  Now almost back to his baseline, but overall slow decline, He took sinemet 9am, 2pm, 6pm,  it does help him moves better, he noticed wearing off, also noticed worsening memory loss   Ultrasound of carotid artery May 2021 less than 39% stenosis bilaterally, antegrade flow bilateral vertebral artery   Echocardiogram in May  2021, no evidence of intracardiac shunting  Update November 12, 2021 SS: In [facility] for gait abnormality, CT scan showed bilateral basal ganglia chronic lacunar infarcts, 10 mm hypodense focus right pons could be infarct or artifact? MRI was negative for acute infarct, did show  atrophy moderate SVD. Discharged to continue Plavix, statin. Since 2/8, feels weak generally, feels more shuffling with gait, trouble getting out of chair. Just started PT/OT this week at home. Is taking Sinemet 25/100, 2 in AM, 2 midday, 2 evening, 1.5 at bedtime. Azilect 1 mg daily. MMSE 28/30. 50% of his self since event. No falls. This reminds his wife of when he had his first stroke (called his wife in to the bedroom, felt couldn't move, dizzy)-same presentation this time. MMSE 28/30.  Labs from ER visit, showed urine with moderate Blood but no infection, CBC CMP showed no significant abnormality.   CTA head:   1. No intracranial large vessel occlusion is identified.  2. Intracranial atherosclerotic disease with multifocal stenoses,  most notably as follows.  3. Severe stenosis within the A3 right anterior cerebral artery.  4. Moderate stenosis within the A3 left anterior cerebral artery.  5. Severe stenosis within the left anterior cerebral artery at the  A3/A4 junction.  6. Severe stenosis within the right PCA at the P2/P3 junction.   UPDATE August 14th 2023: Getitng slower,g etting up from chair, sinement 9am, 1pm, 6pm, 10pm,  take 2 tabs Goes to sleep, gets up sleeps well, in one spot, use once at night,   He has side effect,   No difference. Empty stomch,   He spend most time sitting around.  Get the mail.  Update November 05, 2022 SS: has lost 9 lbs since August 2023. Has a good appetite. Walking is worsening, isn't picking up his feet, 4-5 falls. Using walker. Having bathroom accidents, he doesn't smell it, his wife overwhelmed with cleaning, has been going for several years. Has macular degeneration, vision is poor for ADLs. Takes Sinemet 25/100 mg 2 tables 3 times daily . He is sedentary. On metformin. He complains of nothing hurting. No blood loss.   Labs from PCP 12/19/2022 A1c 5.9, cholesterol 152, triglyceride 161, LDL 85, glucose 122, creatinine 0.83, AST 7, ALT 6, Hgb  13.7, platelet 303.  Update May 13, 2023 SS: seeing GI, food getting stuck in esophagus, gets burps, hiccups, vomiting, full feeling. Seeing GI, he refused endoscopy. Barium swallow was fine. Happens near daily. Moving slower, more falls, since last visit 6 falls, most in bathroom, lose balance. Using walker. He did home health PT, not much help. Remains on Sinemet 25/100 2 tablets 3 times daily (8, 12, 9 PM).  He is sedentary. Lost about 20 lbs in 1 year.  Has not yet taken his second dose of Sinemet, increased rigidity, bradykinesia right upper extremity.  REVIEW OF SYSTEMS: Out of a complete 14 system review of symptoms, the patient complains only of the following symptoms, and all other reviewed systems are negative.  See HPI  ALLERGIES: No Known Allergies  HOME MEDICATIONS: Outpatient Medications Prior to Visit  Medication Sig Dispense Refill   amLODipine-benazepril (LOTREL) 5-20 MG capsule Take 1 capsule by mouth daily.     cetirizine (ZYRTEC) 10 MG tablet Take 10 mg by mouth daily as needed for allergies.     ezetimibe (ZETIA) 10 MG tablet Take 1 tablet (10 mg total) by mouth daily. 90 tablet 3   Lancets (UNILET MICRO-THIN 33G) MISC USE TO test  TWICE DAILY     metFORMIN (GLUCOPHAGE) 1000 MG tablet Take 1,000 mg by mouth 2 (two) times daily with a meal.  3   Multiple Vitamins-Minerals (PRESERVISION AREDS 2+MULTI VIT) CAPS Take 1 tablet by mouth in the morning and at bedtime.     nitroGLYCERIN (NITROSTAT) 0.4 MG SL tablet DISSOLVE 1 TABLET UNDER THE TONGUE EVERY 5 MINUTES AS NEEDED FOR CHEST PAIN. DO NOT EXCEED A TOTAL OF 3 DOSES IN 15 MINUTES.     omeprazole (PRILOSEC) 20 MG capsule Take 1 capsule (20 mg total) by mouth daily. 30 capsule 1   ONETOUCH ULTRA test strip      simvastatin (ZOCOR) 10 MG tablet Take 10 mg by mouth daily.     carbidopa-levodopa (SINEMET IR) 25-100 MG tablet Take 2 tablets by mouth 3 (three) times daily. 540 tablet 3   clopidogrel (PLAVIX) 75 MG tablet Take  1 tablet (75 mg total) by mouth daily. 90 tablet 4   No facility-administered medications prior to visit.    PAST MEDICAL HISTORY: Past Medical History:  Diagnosis Date   Abnormal gait    Fatigue    Hypertension    Prostate cancer (HCC)    Tremor    Vitamin D deficiency     PAST SURGICAL HISTORY: Past Surgical History:  Procedure Laterality Date   CARDIAC SURGERY     bypass   COLONOSCOPY N/A 02/09/2017   Procedure: COLONOSCOPY;  Surgeon: Franky Macho, MD;  [facility];  Service: Gastroenterology;  Laterality: N/A;   HEMORROIDECTOMY     KNEE SURGERY Right     FAMILY HISTORY: Family History  Problem Relation Age of Onset   Dementia Mother    Diabetes Father    Lung cancer Father    Breast cancer Sister    Multiple myeloma Brother     SOCIAL HISTORY: Social History   Socioeconomic History   Marital status: Married    Spouse name: Not on file   Number of children: 1   Years of education: 12   Highest education level: High school graduate  Occupational History   Occupation: Retired  Tobacco Use   Smoking status: Former    Current packs/day: 0.00    Average packs/day: 1 pack/day for 9.0 years (9.0 ttl pk-yrs)    Types: Cigarettes    Start date: 02/24/1962    Quit date: 02/25/1971    Years since quitting: 52.2    Passive exposure: Never   Smokeless tobacco: Former    Types: Snuff  Vaping Use   Vaping status: Never Used  Substance and Sexual Activity   Alcohol use: No    Alcohol/week: 0.0 standard drinks of alcohol   Drug use: No   Sexual activity: Not on file  Other Topics Concern   Not on file  Social History Narrative   Lives at home with his wife.   Right-handed.   2 cups caffeine per day.   Social Determinants of Health   Financial Resource Strain: Not on file  Food Insecurity: Not on file  Transportation Needs: Not on file  Physical Activity: Not on file  Stress: Not on file  Social Connections: Not on file  Intimate Partner  Violence: Not on file   PHYSICAL EXAM  Vitals:   05/13/23 1351 05/13/23 1400  BP: (!) 150/74 (!) 149/74  Pulse: 81 80  Weight: 163 lb 8 oz (74.2 kg)   Height: 5' 9.5" (1.765 m)    Body mass index is 23.8 kg/m.  Generalized: Well developed, in no acute distress     11/12/2021    1:12 PM  MMSE - Mini Mental State Exam  Orientation to time 4  Orientation to Place 5  Registration 3  Attention/ Calculation 5  Recall 2  Language- name 2 objects 2  Language- repeat 1  Language- follow 3 step command 3  Language- read & follow direction 1  Write a sentence 1  Copy design 1  Copy design-comments 4 legged animals x 1 min: 7  Total score 28   Neurological examination  Mentation: Alert, cooperative, very nice, wife provides history . Follows all commands speech, mild masking of the face. Cranial nerve II-XII: Pupils were equal round reactive to light. Extraocular movements were full, visual field were full on confrontational test. Facial sensation and strength were normal.  Head turning and shoulder shrug  were normal and symmetric. Motor: Good strength overall,  Mild bradykinesia, right > left, no tremor, increased rigidity to left upper extremity with reinforcement Sensory: Sensory testing is intact to soft touch on all 4 extremities. No evidence of extinction is noted.  Coordination: Cerebellar testing reveals good finger-nose-finger and heel-to-shin bilaterally.  Is slow. Gait and station:  push off from seated position, cautious, wide based, relies on rolling walker   DIAGNOSTIC DATA (LABS, IMAGING, TESTING) - I reviewed patient records, labs, notes, testing and imaging myself where available.  Lab Results  Component Value Date   WBC 4.7 12/25/2010   HGB 13.8 12/25/2010   HCT 40.5 12/25/2010   MCV 91.8 12/25/2010   PLT 232 12/25/2010      Component Value Date/Time   NA 138 12/25/2010 1347   K 3.9 12/25/2010 1347   CL 104 12/25/2010 1347   CO2 27 12/25/2010 1347    GLUCOSE 174 (H) 12/25/2010 1347   BUN 13 12/25/2010 1347   CREATININE 0.80 11/30/2019 1311   CALCIUM 9.5 12/25/2010 1347   PROT 6.8 04/01/2010 0900   ALBUMIN 3.9 04/01/2010 0900   AST 21 04/01/2010 0900   ALT 21 04/01/2010 0900   ALKPHOS 72 04/01/2010 0900   BILITOT 0.8 04/01/2010 0900   GFRNONAA >60 12/25/2010 1347   GFRAA  12/25/2010 1347    >60        The eGFR has been calculated using the MDRD equation. This calculation has not been validated in all clinical situations. eGFR's persistently <60 mL/min signify possible Chronic Kidney Disease.   No results found for: "CHOL", "HDL", "LDLCALC", "LDLDIRECT", "TRIG", "CHOLHDL" No results found for: "HGBA1C" Lab Results  Component Value Date   VITAMINB12 284 11/07/2019   Lab Results  Component Value Date   TSH 0.830 04/19/2023   Otila Kluver, DNP  [facility] [address] [place], [place] [address] [phone]

## 2023-05-13 ENCOUNTER — Ambulatory Visit: Payer: Medicare Other | Admitting: Neurology

## 2023-05-13 ENCOUNTER — Encounter: Payer: Self-pay | Admitting: Neurology

## 2023-05-13 VITALS — BP 149/74 | HR 80 | Ht 69.5 in | Wt 163.5 lb

## 2023-05-13 DIAGNOSIS — R269 Unspecified abnormalities of gait and mobility: Secondary | ICD-10-CM | POA: Diagnosis not present

## 2023-05-13 DIAGNOSIS — G20C Parkinsonism, unspecified: Secondary | ICD-10-CM | POA: Diagnosis not present

## 2023-05-13 DIAGNOSIS — I639 Cerebral infarction, unspecified: Secondary | ICD-10-CM

## 2023-05-13 MED ORDER — CLOPIDOGREL BISULFATE 75 MG PO TABS: 75.0000 mg | ORAL_TABLET | Freq: Every day | ORAL | 4 refills | Status: DC

## 2023-05-13 MED ORDER — CARBIDOPA-LEVODOPA 25-100 MG PO TABS: 2.0000 | ORAL_TABLET | Freq: Three times a day (TID) | ORAL | 3 refills | Status: DC

## 2023-05-13 NOTE — Patient Instructions (Signed)
Try Sinemet 25/100 2 tablets at 8AM, 12 PM, 4 or 5 PM. If you think you need a 4th dose let me know. Continue the Plavix for stroke prevention. Try to increase your activity. See you back in 6 months

## 2023-06-01 DIAGNOSIS — E1140 Type 2 diabetes mellitus with diabetic neuropathy, unspecified: Secondary | ICD-10-CM | POA: Diagnosis not present

## 2023-06-01 DIAGNOSIS — M79672 Pain in left foot: Secondary | ICD-10-CM | POA: Diagnosis not present

## 2023-06-01 DIAGNOSIS — M79675 Pain in left toe(s): Secondary | ICD-10-CM | POA: Diagnosis not present

## 2023-06-01 DIAGNOSIS — I739 Peripheral vascular disease, unspecified: Secondary | ICD-10-CM | POA: Diagnosis not present

## 2023-06-01 DIAGNOSIS — M79671 Pain in right foot: Secondary | ICD-10-CM | POA: Diagnosis not present

## 2023-06-01 DIAGNOSIS — L110 Acquired keratosis follicularis: Secondary | ICD-10-CM | POA: Diagnosis not present

## 2023-06-01 DIAGNOSIS — M79674 Pain in right toe(s): Secondary | ICD-10-CM | POA: Diagnosis not present

## 2023-06-14 ENCOUNTER — Ambulatory Visit: Payer: Medicare Other | Admitting: Gastroenterology

## 2023-06-14 ENCOUNTER — Telehealth: Payer: Self-pay | Admitting: *Deleted

## 2023-06-14 ENCOUNTER — Encounter: Payer: Self-pay | Admitting: Gastroenterology

## 2023-06-14 VITALS — BP 135/76 | HR 79 | Temp 97.7°F | Ht 69.0 in | Wt 163.8 lb

## 2023-06-14 DIAGNOSIS — Z860100 Personal history of colon polyps, unspecified: Secondary | ICD-10-CM

## 2023-06-14 DIAGNOSIS — R634 Abnormal weight loss: Secondary | ICD-10-CM | POA: Diagnosis not present

## 2023-06-14 DIAGNOSIS — R1310 Dysphagia, unspecified: Secondary | ICD-10-CM

## 2023-06-14 NOTE — Progress Notes (Signed)
GI Office Note    Referring Provider: Benita Stabile, MD Primary Care Physician:  Benita Stabile, MD Primary Gastroenterologist: Dr. Tasia Catchings  Date:  06/14/2023  ID:  [name], DOB 1940-12-24, MRN [id number]   Chief Complaint   Chief Complaint  Patient presents with   Follow-up    Follow up. Still having problems with food getting stuck in throat. It feels like it is just sitting there. Stop taking omeprazole    History of Present Illness  [name] is a 82 y.o. male with a history of diabetes, stroke x 2 weeks, HTN, prostate cancer, MI s/p CABG x 319 9, and vitamin D deficiency presenting today with complaint of ongoing dysphagia.  Colonoscopy in 2018 Dr. Lovell Sheehan: -2 mm polyp in cecum -3 mm polyp in mid transverse -sigmoid diverticulosis -Recommended 5 year repeat  Last office visit 04/08/23.  Patient reported few episodes of food getting stuck at the top of his chest and this is occurring 1-2 times per weeks.  Will get the hiccups and then sometimes vomit.  Wife reported he was complaining of lots of chest pressure when this occurs.  Has a good appetite but will stop eating once he symptoms occur.  Sometimes regurgitates up food/vomits.  Wife reported a 40 pound weight loss over the last year and 1/2 to 2 years.  No history of upper endoscopy.  Denies any constipation, diarrhea, melena, or BRBPR.  Labs 04/19/2023: CRP, TSH, T4 normal  BPE 04/19/23: -Barium tablet has not only -No significant abnormality identified -No esophageal reflux -Offered MBSS.  Today: Have not been contacted by speech. Gets feeling that things are still stuck in his throat and then he will throw up. A few times it does not bother him at all. Wife reports if it happens at breakfast the rest of the day he is fine the rest of the day but if it comes at night then he may be okay at breakfast. He tries to avoid regurgitation if he can. Did not see difference with the omeprazole so stopped after 1 month.    Wife states his neurologist recently modified timing of his carbidopa and that has been helping his gait  Denies any overt nausea, constipation, or diarrhea.  Denies any chest pain or shortness of breath.   Wt Readings from Last 3 Encounters:  06/14/23 163 lb 12.8 oz (74.3 kg)  05/13/23 163 lb 8 oz (74.2 kg)  04/08/23 166 lb 12.8 oz (75.7 kg)       Current Outpatient Medications  Medication Sig Dispense Refill   amLODipine-benazepril (LOTREL) 5-20 MG capsule Take 1 capsule by mouth daily.     carbidopa-levodopa (SINEMET IR) 25-100 MG tablet Take 2 tablets by mouth 3 (three) times daily. 540 tablet 3   cetirizine (ZYRTEC) 10 MG tablet Take 10 mg by mouth daily as needed for allergies.     clopidogrel (PLAVIX) 75 MG tablet Take 1 tablet (75 mg total) by mouth daily. 90 tablet 4   ezetimibe (ZETIA) 10 MG tablet Take 1 tablet (10 mg total) by mouth daily. 90 tablet 3   Lancets (UNILET MICRO-THIN 33G) MISC USE TO test TWICE DAILY     metFORMIN (GLUCOPHAGE) 1000 MG tablet Take 1,000 mg by mouth 2 (two) times daily with a meal.  3   Multiple Vitamins-Minerals (PRESERVISION AREDS 2+MULTI VIT) CAPS Take 1 tablet by mouth in the morning and at bedtime.     nitroGLYCERIN (NITROSTAT) 0.4 MG SL tablet DISSOLVE 1  TABLET UNDER THE TONGUE EVERY 5 MINUTES AS NEEDED FOR CHEST PAIN. DO NOT EXCEED A TOTAL OF 3 DOSES IN 15 MINUTES.     ONETOUCH ULTRA test strip      simvastatin (ZOCOR) 10 MG tablet Take 10 mg by mouth daily.     omeprazole (PRILOSEC) 20 MG capsule Take 1 capsule (20 mg total) by mouth daily. (Patient not taking: Reported on 06/14/2023) 30 capsule 1   No current facility-administered medications for this visit.    Past Medical History:  Diagnosis Date   Abnormal gait    Fatigue    Hypertension    Prostate cancer (HCC)    Tremor    Vitamin D deficiency     Past Surgical History:  Procedure Laterality Date   CARDIAC SURGERY     bypass   COLONOSCOPY N/A 02/09/2017    Procedure: COLONOSCOPY;  Surgeon: Franky Macho, MD;  Location[facility];  Service: Gastroenterology;  Laterality: N/A;   HEMORROIDECTOMY     KNEE SURGERY Right     Family History  Problem Relation Age of Onset   Dementia Mother    Diabetes Father    Lung cancer Father    Breast cancer Sister    Multiple myeloma Brother     Allergies as of 06/14/2023   (No Known Allergies)    Social History   Socioeconomic History   Marital status: Married    Spouse name: Not on file   Number of children: 1   Years of education: 12   Highest education level: High school graduate  Occupational History   Occupation: Retired  Tobacco Use   Smoking status: Former    Current packs/day: 0.00    Average packs/day: 1 pack/day for 9.0 years (9.0 ttl pk-yrs)    Types: Cigarettes    Start date: 02/24/1962    Quit date: 02/25/1971    Years since quitting: 52.3    Passive exposure: Never   Smokeless tobacco: Former    Types: Snuff  Vaping Use   Vaping status: Never Used  Substance and Sexual Activity   Alcohol use: No    Alcohol/week: 0.0 standard drinks of alcohol   Drug use: No   Sexual activity: Not on file  Other Topics Concern   Not on file  Social History Narrative   Lives at home with his wife.   Right-handed.   2 cups caffeine per day.   Social Determinants of Health   Financial Resource Strain: Not on file  Food Insecurity: Not on file  Transportation Needs: Not on file  Physical Activity: Not on file  Stress: Not on file  Social Connections: Not on file     Review of Systems   Gen: Denies fever, chills, anorexia. Denies fatigue, weakness, weight loss.  CV: Denies chest pain, palpitations, syncope, peripheral edema, and claudication. Resp: Denies dyspnea at rest, cough, wheezing, coughing up blood, and pleurisy. GI: See HPI Derm: Denies rash, itching, dry skin Psych: Denies depression, anxiety, memory loss, confusion. No homicidal or suicidal ideation.  Heme:  Denies bruising, bleeding, and enlarged lymph nodes.   Physical Exam   BP 135/76 (BP Location: Right Arm, Patient Position: Sitting, Cuff Size: Normal)   Pulse 79   Temp 97.7 F (36.5 C) (Temporal)   Ht 5\' 9"  (1.753 m)   Wt 163 lb 12.8 oz (74.3 kg)   SpO2 98%   BMI 24.19 kg/m   General:   Alert and oriented. No distress noted. Pleasant and cooperative.  Head:  Normocephalic and atraumatic. Eyes:  Conjuctiva clear without scleral icterus. Mouth:  Oral mucosa pink and moist. Good dentition. No lesions. Lungs:  Clear to auscultation bilaterally. No wheezes, rales, or rhonchi. No distress.  Heart:  S1, S2 present without murmurs appreciated.  Abdomen:  +BS, soft, non-tender and non-distended. No rebound or guarding. No HSM or masses noted. Rectal: deferred Msk: Decreased muscle tone.  Hunched over posture.  Uses four-wheel walker, unsteady gait. Extremities:  Without edema. Neurologic:  Alert and oriented x4 Psych:  Alert and cooperative. Normal mood and affect.  Assessment  JEFFY KORCH is a 82 y.o. male with a history of diabetes, stroke x 2 weeks, HTN, prostate cancer, MI s/p CABG x 319 9, and vitamin D deficiency presenting today for follow-up of dysphagia/regurgitation and weight loss.  Dysphagia: For the last several months he has had worsening intermittent symptoms of dysphagia, sometimes occurs daily and usually will only be with 1 meal and then may be fine the next.  Continues to have some regurgitation/vomiting that occurs secondary to globus sensation and feel like food is stuck.  BPE without any stricturing or abnormality noted.  Tried trial of omeprazole for 1 month without relief therefore he stopped medication.  Had not heard from speech therapy therefore we will refer to perform modified barium swallow.  Patient and his wife would like to proceed upper endoscopy for further evaluation with consideration for dilation.  Weight loss: Previously reported 40 pound weight  loss over 1.5-2 years.  Has only lost a few pounds since his last visit in July.  Wife concerned given his Parkinson's and his weight loss and the potential for malignancy occurring despite normal blood work.  Last colonoscopy in 2018 and he was not interested in repeat colonoscopy previously.  Now agreeable to pursue at least upper endoscopy.  Recent TSH and CRP within normal limits.  Will pursue chest x-ray and CT scan of the abdomen pelvis to evaluate for unintentional weight loss.  Suspect his weight loss is secondary to his multiple comorbidities however in light of his dysphagia as well we will perform imaging to rule out any other organic cause.  History of colon polyps: History of colon polyps in 2018 and was recommended to have a 5-year repeat.  Currently still not interested in repeating colonoscopy.  He has previously indicated his understanding of the risk by not completing a colonoscopy.  PLAN   MBSS with speech therapy Proceed with upper endoscopy with possible dilation propofol by Dr. Tasia Catchings in near future: the risks, benefits, and alternatives have been discussed with the patient in detail. The patient states understanding and desires to proceed. ASA 3  Clearance to hold Plavix for 5 days  Cardiac clearance - medical clearance Small bites and alternate with sips of liquids, chewing precautions discussed.  CT A/P and CXR to assess for weight loss Increase protein intake Follow up in 3 months.     Brooke Bonito, MSN, FNP-BC, AGACNP-BC [facility]

## 2023-06-14 NOTE — Telephone Encounter (Addendum)
Patient Name: HRAG LITAKER  DOB: 14-Oct-1940 MRN: [id number]  Primary Cardiologist: Marjo Bicker, MD  Chart reviewed as part of pre-operative protocol coverage.   Patient already has an upcoming appointment scheduled 07/12/23 for annual follow-up with Dr. Jenene Slicker. Per discussion with GI NP, OK to keep this appointment to discuss cardiac clearance for EGD as she would prefer the patient see speech before undergoing the scope. Also has CT scan planned. I added "preop" comment to appointment notes so that provider is aware to address at time of OV. Per office protocol, the provider seeing this patient should forward their finalized clearance decision to requesting party below. I will route to Dr. Jenene Slicker as Lorain Childes only to review at OV on 10/21.  - Regarding Plavix: it looks like this is prescribed by neurology for history of stroke therefore recommend GI Team reach out to neurology to discuss. Cardiology MD can provide additional recommendation at time of virtual visit on substituting this for ASA while off Plavix given h/o CABG.  Update routed back to requesting GI so they are aware. Will remove from preop box as separate preop APP input not necessary at this time.  Callback team: Disregard, no need for virtual visit, changed plan/note as above.  Laurann Montana, PA-C 06/14/2023, 5:05 PM

## 2023-06-14 NOTE — Telephone Encounter (Signed)
Request for patient to stop medication prior to procedure or is needing cleareance  06/14/23  [name] 1941/05/09  What type of surgery is being performed? EGD with possible dilation  When is surgery scheduled? TBD  What type of clearance is required (medical or pharmacy to hold medication or both? both  Are there any medications that need to be held prior to surgery and how long? Plavix 5 days prior  Name of physician performing surgery?  Dr. Starleen Arms [facility] Phone: [phone] Fax: [phone]  Anethesia type (none, local, MAC, general)? MAC

## 2023-06-14 NOTE — Patient Instructions (Addendum)
Chewing precautions: Food should be easy to cut and chew. Avoid large pieces of food that require a lot of chewing. Take small bites. Each bite should be smaller than your thumbnail (about 15 mm by 15 mm for adults) Avoid foods that are very dry, hard, sticky, chewy, coarse, or crunchy. Chew until food is very moist and soft prior to swallowing Alternate small bites with sips of liquids  We will send referral to speech therapy to perform a modified barium swallow study to further assess your feeling of food being stuck in your throat.  In regards to the weight loss I would recommend increasing protein intake, you can do this by supplementing with a protein shake daily.  Will also schedule you for an upper endoscopy with possible dilation in the near future with Dr. Tasia Catchings.  We will reach out to cardiology for clearance.  I have ordered a chest x-ray as well as a CT scan of the abdomen pelvis to further evaluate the weight loss.  Our schedulers will contact you with a date and time for CT scan.  The chest x-ray you may go to the hospital at any point in time to have this done.  Follow up in 3 months.   It was a pleasure to see you today. I want to create trusting relationships with patients. If you receive a survey regarding your visit,  I greatly appreciate you taking time to fill this out on paper or through your MyChart. I value your feedback.  Brooke Bonito, MSN, FNP-BC, AGACNP-BC [facility]

## 2023-06-14 NOTE — Telephone Encounter (Signed)
Called pt, no answer, to give CT appt appt details. 10/31, arrival 245pm at [facility]

## 2023-06-15 ENCOUNTER — Telehealth: Payer: Self-pay | Admitting: *Deleted

## 2023-06-15 NOTE — Telephone Encounter (Signed)
LMOVM to call back to give CT appt details

## 2023-06-15 NOTE — Telephone Encounter (Signed)
GI will await Dr. Antoine Poche full office note with clearance prior to scheduling. Dr. Jenene Slicker will communicate directly with the requesting office. I will remove from the preop pool.

## 2023-06-15 NOTE — Telephone Encounter (Signed)
Chart reviewed.  Patient on Plavix 75 mg daily post stroke 2021 subacute lacunar infarction along with cerebrovascular disease.  Also Parkinson's patient on Sinemet.  GI would like to perform EGD with possible dilation.  Asking for clearance to hold Plavix 5 days prior to procedure.  Can hold plavix for 3-5 days (or as requested) with small but acceptable risk of recurrent stroke while off therapy, recommend restarting immediately after or once hemodynamically stable.

## 2023-06-15 NOTE — Telephone Encounter (Signed)
Request for patient to stop medication prior to procedure or is needing cleareance  06/15/23  ANTONIN LAFITTE [birth year]  What type of surgery is being performed? EGD with possible dilation  When is surgery scheduled? TBD  Are there any medications that need to be held prior to surgery and how long? PLAVIX x 5 days prior to procedure  Name of physician performing surgery?  Dr. Starleen Arms Gastroenterology at [facility] Phone: [phone] Fax: [phone]  Anethesia type (none, local, MAC, general)? MAC

## 2023-06-16 NOTE — Telephone Encounter (Signed)
LMOVM to call back. Letter mailed with CT appt

## 2023-07-06 NOTE — Telephone Encounter (Signed)
noted 

## 2023-07-06 NOTE — Telephone Encounter (Signed)
Toni Amend, is it okay to schedule? Thanks!

## 2023-07-09 DIAGNOSIS — E782 Mixed hyperlipidemia: Secondary | ICD-10-CM | POA: Diagnosis not present

## 2023-07-09 DIAGNOSIS — R634 Abnormal weight loss: Secondary | ICD-10-CM | POA: Diagnosis not present

## 2023-07-09 DIAGNOSIS — Z0000 Encounter for general adult medical examination without abnormal findings: Secondary | ICD-10-CM | POA: Diagnosis not present

## 2023-07-09 DIAGNOSIS — R7301 Impaired fasting glucose: Secondary | ICD-10-CM | POA: Diagnosis not present

## 2023-07-12 ENCOUNTER — Ambulatory Visit: Payer: Medicare Other | Attending: Internal Medicine | Admitting: Internal Medicine

## 2023-07-12 ENCOUNTER — Encounter: Payer: Self-pay | Admitting: Internal Medicine

## 2023-07-12 VITALS — BP 138/72 | HR 91 | Ht 69.0 in | Wt 163.8 lb

## 2023-07-12 DIAGNOSIS — I25708 Atherosclerosis of coronary artery bypass graft(s), unspecified, with other forms of angina pectoris: Secondary | ICD-10-CM | POA: Diagnosis not present

## 2023-07-12 DIAGNOSIS — Z7902 Long term (current) use of antithrombotics/antiplatelets: Secondary | ICD-10-CM | POA: Insufficient documentation

## 2023-07-12 NOTE — Telephone Encounter (Signed)
Spoke with pt and spouse. He has been scheduled for 11/14. Aware will call back with pre-op appt. Plavix to be held.

## 2023-07-12 NOTE — Addendum Note (Signed)
Addended by: Armstead Peaks on: 07/12/2023 03:03 PM   Modules accepted: Orders

## 2023-07-12 NOTE — Progress Notes (Signed)
Cardiology Office Note  Date: 07/12/2023   ID: [name], DOB 04-13-1941, MRN [id number]  PCP:  Benita Stabile, MD  Cardiologist:  Marjo Bicker, MD Electrophysiologist:  None   Reason for Office Visit: Follow-up of CAD   History of Present Illness: [name] is a 82 y.o. male known to have CAD status post CABG in 1999 at [facility], history of stroke, Parkinson's disease, HTN, HLD presented to cardiology clinic for follow-up visit. Patient had a normal nuclear stress test in 2017 and echocardiogram from 2021 showed normal left ventricular systolic function, LVEF 50 to 40% and mild diastolic dysfunction. Patient is here for follow-up visit, accompanied by wife.   Wife is a sole caretaker of the patient. Patient denies having any angina, DOE, dizziness, presyncope, syncope and leg swelling. No palpitations. Compliant with medications and has no side effects. No bleeding complications. Physically not active at baseline due to Parkinson disease.  Past Medical History:  Diagnosis Date   Abnormal gait    Fatigue    Hypertension    Prostate cancer (HCC)    Tremor    Vitamin D deficiency     Past Surgical History:  Procedure Laterality Date   CARDIAC SURGERY     bypass   COLONOSCOPY N/A 02/09/2017   Procedure: COLONOSCOPY;  Surgeon: Franky Macho, MD;  [facility];  Service: Gastroenterology;  Laterality: N/A;   HEMORROIDECTOMY     KNEE SURGERY Right     Current Outpatient Medications  Medication Sig Dispense Refill   amLODipine-benazepril (LOTREL) 5-20 MG capsule Take 1 capsule by mouth daily.     carbidopa-levodopa (SINEMET IR) 25-100 MG tablet Take 2 tablets by mouth 3 (three) times daily. 540 tablet 3   cetirizine (ZYRTEC) 10 MG tablet Take 10 mg by mouth daily as needed for allergies.     clopidogrel (PLAVIX) 75 MG tablet Take 1 tablet (75 mg total) by mouth daily. 90 tablet 4   ezetimibe (ZETIA) 10 MG tablet Take 1 tablet (10 mg total) by  mouth daily. 90 tablet 3   Lancets (UNILET MICRO-THIN 33G) MISC USE TO test TWICE DAILY     metFORMIN (GLUCOPHAGE) 1000 MG tablet Take 1,000 mg by mouth 2 (two) times daily with a meal.  3   Multiple Vitamins-Minerals (PRESERVISION AREDS 2+MULTI VIT) CAPS Take 1 tablet by mouth in the morning and at bedtime.     nitroGLYCERIN (NITROSTAT) 0.4 MG SL tablet DISSOLVE 1 TABLET UNDER THE TONGUE EVERY 5 MINUTES AS NEEDED FOR CHEST PAIN. DO NOT EXCEED A TOTAL OF 3 DOSES IN 15 MINUTES.     ONETOUCH ULTRA test strip      simvastatin (ZOCOR) 10 MG tablet Take 10 mg by mouth daily.     No current facility-administered medications for this visit.   Allergies:  Patient has no known allergies.   Social History: The patient  reports that he quit smoking about 52 years ago. His smoking use included cigarettes. He started smoking about 82 years ago. He has a 9 pack-year smoking history. He has never been exposed to tobacco smoke. He has quit using smokeless tobacco.  His smokeless tobacco use included snuff. He reports that he does not drink alcohol and does not use drugs.   Family History: The patient's family history includes Breast cancer in his sister; Dementia in his mother; Diabetes in his father; Lung cancer in his father; Multiple myeloma in his brother.   ROS:  Please see  the history of present illness. Otherwise, complete review of systems is positive for none.  All other systems are reviewed and negative.   Physical Exam: VS:  BP 138/72   Pulse 91   Ht 5\' 9"  (1.753 m)   Wt 163 lb 12.8 oz (74.3 kg)   SpO2 97%   BMI 24.19 kg/m , BMI Body mass index is 24.19 kg/m.  Wt Readings from Last 3 Encounters:  07/12/23 163 lb 12.8 oz (74.3 kg)  06/14/23 163 lb 12.8 oz (74.3 kg)  05/13/23 163 lb 8 oz (74.2 kg)    General: Patient appears comfortable at rest. HEENT: Conjunctiva and lids normal, oropharynx clear with moist mucosa. Neck: Supple, no elevated JVP or carotid bruits, no thyromegaly. Lungs:  Clear to auscultation, nonlabored breathing at rest. Cardiac: Regular rate and rhythm, no S3 or significant systolic murmur, no pericardial rub. Abdomen: Soft, nontender, no hepatomegaly, bowel sounds present, no guarding or rebound. Extremities: No pitting edema, distal pulses 2+. Skin: Warm and dry. Musculoskeletal: No kyphosis. Neuropsychiatric: Alert and oriented x3, affect grossly appropriate.  ECG:  An ECG dated 07/20/2022 was personally reviewed today and demonstrated:  Normal sinus rhythm and no ST-T changes  Recent Labwork: 04/19/2023: TSH 0.830  No results found for: "CHOL", "TRIG", "HDL", "CHOLHDL", "VLDL", "LDLCALC", "LDLDIRECT"  Other Studies Reviewed Today: Echo in 2021 1. Left ventricular ejection fraction, by estimation, is 50 to 55%. The  left ventricle has low normal function. The left ventricle has no regional  wall motion abnormalities. The left ventricular internal cavity size was  mildly dilated. There is mild  left ventricular hypertrophy. Left ventricular diastolic parameters are  consistent with Grade I diastolic dysfunction (impaired relaxation).   2. Right ventricular systolic function is normal. The right ventricular  size is normal. There is normal pulmonary artery systolic pressure. The  estimated right ventricular systolic pressure is 24.0 mmHg.   3. The mitral valve is grossly normal. Trivial mitral valve  regurgitation.   4. The aortic valve is tricuspid. Aortic valve regurgitation is not  visualized. Mild aortic valve sclerosis is present, with no evidence of  aortic valve stenosis.   5. The inferior vena cava is normal in size with greater than 50%  respiratory variability, suggesting right atrial pressure of 3 mmHg.   6. The atrial septum is mobile with apparent bidirectional shunting by  color Doppler consistent with PFO, subcostals are suboptimal. Suggest  bubble study.  NM stress test in 2017 No evidence of ischemia  Assessment and  Plan: Patient is a 82 year old M known to have CAD status post CABG 1999 at [facility] with normal LVEF 50 to 55%, HTN, HLD, history of stroke, Parkinson's disease presented to cardiology clinic for follow-up visit.  Accompanied by wife.  # Preop cardiac risk certification for EGD with dilatation -No angina or DOE, low risk for any perioperative cardiac complications.  No further cardiac testing is indicated prior to proceeding with the planned procedure.  Plavix can be held for 5 days prior to procedure and resume after the procedure when safe from bleeding standpoint. In the interim, can substitute Plavix with aspirin 81 mg once daily for 5 days prior to procedure.  #CAD status post CABG 1999 at [facility] with normal LVEF 50 to 55% Plan -Due to history of stroke and presence of CABG, has been on Plavix monotherapy.  Continue simvastatin 10 mg nightly, SL NTG 0.4 mg as needed, ER precautions for chest pain.  #HLD, at goal Plan -Continue  simvastatin 10 mg nightly, normal limits per wife.  #HTN, controlled Plan -Continue amlodipine 10 mg once daily.  I spent a total duration of 30 minutes reviewing the prior notes, labs, imaging studies, face-to-face discussion/counseling of his medical condition, pathophysiology, evaluation, management, reviewing his medications, answering all the questions and documenting the findings in the note.   Disposition:  Follow up  1 year  Signed Jack Mineau Verne Spurr, MD, 07/12/2023 11:16 AM    [facility] at [facility] [address] [place], [place], [place] [address]

## 2023-07-12 NOTE — Progress Notes (Signed)
NOTES HAVE BEEN SENT TO REQUESTING OFFICE

## 2023-07-12 NOTE — Patient Instructions (Signed)
Medication Instructions:  Your physician recommends that you continue on your current medications as directed. Please refer to the Current Medication list given to you today.  Hold Plavix for 5 days for EGD per Dr. Jenene Slicker can take baby aspirin 81 mg in the meantime until EGD. After the EGD can resume Plavix.  Labwork: None  Testing/Procedures: None  Follow-Up: Your physician recommends that you schedule a follow-up appointment in: 1 year. You will receive a reminder call in about 8 months reminding you to schedule your appointment. If you don't receive this call, please contact our office.   Any Other Special Instructions Will Be Listed Below (If Applicable). Thank you for choosing [facility]!      If you need a refill on your cardiac medications before your next appointment, please call your pharmacy.

## 2023-07-13 ENCOUNTER — Encounter: Payer: Self-pay | Admitting: *Deleted

## 2023-07-16 DIAGNOSIS — E1165 Type 2 diabetes mellitus with hyperglycemia: Secondary | ICD-10-CM | POA: Diagnosis not present

## 2023-07-16 DIAGNOSIS — E782 Mixed hyperlipidemia: Secondary | ICD-10-CM | POA: Diagnosis not present

## 2023-07-16 DIAGNOSIS — N3281 Overactive bladder: Secondary | ICD-10-CM | POA: Diagnosis not present

## 2023-07-16 DIAGNOSIS — I10 Essential (primary) hypertension: Secondary | ICD-10-CM | POA: Diagnosis not present

## 2023-07-16 DIAGNOSIS — H3530 Unspecified macular degeneration: Secondary | ICD-10-CM | POA: Diagnosis not present

## 2023-07-16 DIAGNOSIS — R634 Abnormal weight loss: Secondary | ICD-10-CM | POA: Diagnosis not present

## 2023-07-16 DIAGNOSIS — R269 Unspecified abnormalities of gait and mobility: Secondary | ICD-10-CM | POA: Diagnosis not present

## 2023-07-16 DIAGNOSIS — I2510 Atherosclerotic heart disease of native coronary artery without angina pectoris: Secondary | ICD-10-CM | POA: Diagnosis not present

## 2023-07-16 DIAGNOSIS — Z23 Encounter for immunization: Secondary | ICD-10-CM | POA: Diagnosis not present

## 2023-07-16 DIAGNOSIS — G20A1 Parkinson's disease without dyskinesia, without mention of fluctuations: Secondary | ICD-10-CM | POA: Diagnosis not present

## 2023-07-19 ENCOUNTER — Telehealth: Payer: Self-pay | Admitting: Gastroenterology

## 2023-07-19 NOTE — Telephone Encounter (Signed)
Labs reviewed from PCP dated 10/18.  Unremarkable CMP.  Hemoglobin stable at 12.8, platelets 289, MCV 95.   TSH within normal limits.  A1c 5.8  Total cholesterol 98.  Normal triglycerides, HDL, and LDL.

## 2023-07-22 ENCOUNTER — Ambulatory Visit (HOSPITAL_COMMUNITY)
Admission: RE | Admit: 2023-07-22 | Discharge: 2023-07-22 | Disposition: A | Discharge status: Home / Self Care | Payer: Medicare Other | Source: Ambulatory Visit | Attending: Gastroenterology | Admitting: Gastroenterology

## 2023-07-22 DIAGNOSIS — K8020 Calculus of gallbladder without cholecystitis without obstruction: Secondary | ICD-10-CM | POA: Diagnosis not present

## 2023-07-22 DIAGNOSIS — Q438 Other specified congenital malformations of intestine: Secondary | ICD-10-CM | POA: Diagnosis not present

## 2023-07-22 DIAGNOSIS — R634 Abnormal weight loss: Secondary | ICD-10-CM | POA: Diagnosis not present

## 2023-07-22 LAB — POCT I-STAT CREATININE: Creatinine, Ser: 0.7 mg/dL (ref 0.61–1.24)

## 2023-07-22 MED ORDER — IOHEXOL 300 MG/ML  SOLN
100.0000 mL | Freq: Once | INTRAMUSCULAR | Status: AC | PRN
  Administered 2023-07-22: 100 mL via INTRAVENOUS

## 2023-08-02 ENCOUNTER — Encounter (HOSPITAL_COMMUNITY): Payer: Self-pay

## 2023-08-02 ENCOUNTER — Encounter (HOSPITAL_COMMUNITY)
Admission: RE | Admit: 2023-08-02 | Discharge: 2023-08-02 | Disposition: A | Discharge status: Home / Self Care | Payer: Medicare Other | Source: Ambulatory Visit | Attending: Gastroenterology | Admitting: Gastroenterology

## 2023-08-02 ENCOUNTER — Other Ambulatory Visit: Payer: Self-pay

## 2023-08-02 HISTORY — DX: Acute myocardial infarction, unspecified: I21.9

## 2023-08-02 HISTORY — DX: Type 2 diabetes mellitus without complications: E11.9

## 2023-08-02 HISTORY — DX: Unspecified osteoarthritis, unspecified site: M19.90

## 2023-08-02 HISTORY — DX: Cerebral infarction, unspecified: I63.9

## 2023-08-02 HISTORY — DX: Gastro-esophageal reflux disease without esophagitis: K21.9

## 2023-08-02 HISTORY — DX: Parkinson's disease without dyskinesia, without mention of fluctuations: G20.A1

## 2023-08-02 HISTORY — DX: Unspecified macular degeneration: H35.30

## 2023-08-02 NOTE — Pre-Procedure Instructions (Signed)
Attempted pre-op phone call. Left VM for him to call back.

## 2023-08-05 ENCOUNTER — Ambulatory Visit (HOSPITAL_COMMUNITY): Payer: Medicare Other | Admitting: Anesthesiology

## 2023-08-05 ENCOUNTER — Telehealth: Payer: Self-pay | Admitting: Gastroenterology

## 2023-08-05 ENCOUNTER — Encounter (HOSPITAL_COMMUNITY)
Admission: RE | Disposition: A | Discharge status: Home / Self Care | Payer: Self-pay | Source: Home / Self Care | Attending: Gastroenterology

## 2023-08-05 ENCOUNTER — Ambulatory Visit (HOSPITAL_COMMUNITY)
Admission: RE | Admit: 2023-08-05 | Discharge: 2023-08-05 | Disposition: A | Discharge status: Home / Self Care | Payer: Medicare Other | Attending: Gastroenterology | Admitting: Gastroenterology

## 2023-08-05 ENCOUNTER — Other Ambulatory Visit: Payer: Self-pay

## 2023-08-05 DIAGNOSIS — Z8546 Personal history of malignant neoplasm of prostate: Secondary | ICD-10-CM | POA: Insufficient documentation

## 2023-08-05 DIAGNOSIS — I2510 Atherosclerotic heart disease of native coronary artery without angina pectoris: Secondary | ICD-10-CM | POA: Insufficient documentation

## 2023-08-05 DIAGNOSIS — G4730 Sleep apnea, unspecified: Secondary | ICD-10-CM | POA: Insufficient documentation

## 2023-08-05 DIAGNOSIS — R09A2 Foreign body sensation, throat: Secondary | ICD-10-CM | POA: Diagnosis not present

## 2023-08-05 DIAGNOSIS — I252 Old myocardial infarction: Secondary | ICD-10-CM | POA: Diagnosis not present

## 2023-08-05 DIAGNOSIS — Z87891 Personal history of nicotine dependence: Secondary | ICD-10-CM | POA: Diagnosis not present

## 2023-08-05 DIAGNOSIS — K2289 Other specified disease of esophagus: Secondary | ICD-10-CM

## 2023-08-05 DIAGNOSIS — K2950 Unspecified chronic gastritis without bleeding: Secondary | ICD-10-CM | POA: Diagnosis not present

## 2023-08-05 DIAGNOSIS — G20A1 Parkinson's disease without dyskinesia, without mention of fluctuations: Secondary | ICD-10-CM | POA: Insufficient documentation

## 2023-08-05 DIAGNOSIS — E119 Type 2 diabetes mellitus without complications: Secondary | ICD-10-CM | POA: Insufficient documentation

## 2023-08-05 DIAGNOSIS — R1110 Vomiting, unspecified: Secondary | ICD-10-CM

## 2023-08-05 DIAGNOSIS — R1312 Dysphagia, oropharyngeal phase: Secondary | ICD-10-CM

## 2023-08-05 DIAGNOSIS — Z833 Family history of diabetes mellitus: Secondary | ICD-10-CM | POA: Insufficient documentation

## 2023-08-05 DIAGNOSIS — Z8673 Personal history of transient ischemic attack (TIA), and cerebral infarction without residual deficits: Secondary | ICD-10-CM | POA: Insufficient documentation

## 2023-08-05 DIAGNOSIS — R1310 Dysphagia, unspecified: Secondary | ICD-10-CM | POA: Diagnosis not present

## 2023-08-05 DIAGNOSIS — Z7902 Long term (current) use of antithrombotics/antiplatelets: Secondary | ICD-10-CM | POA: Insufficient documentation

## 2023-08-05 DIAGNOSIS — K2970 Gastritis, unspecified, without bleeding: Secondary | ICD-10-CM

## 2023-08-05 DIAGNOSIS — Z951 Presence of aortocoronary bypass graft: Secondary | ICD-10-CM | POA: Insufficient documentation

## 2023-08-05 DIAGNOSIS — K3189 Other diseases of stomach and duodenum: Secondary | ICD-10-CM | POA: Diagnosis not present

## 2023-08-05 DIAGNOSIS — I10 Essential (primary) hypertension: Secondary | ICD-10-CM | POA: Diagnosis not present

## 2023-08-05 HISTORY — PX: ESOPHAGEAL DILATION: SHX303

## 2023-08-05 HISTORY — PX: ESOPHAGOGASTRODUODENOSCOPY (EGD) WITH PROPOFOL: SHX5813

## 2023-08-05 HISTORY — PX: BIOPSY: SHX5522

## 2023-08-05 LAB — GLUCOSE, CAPILLARY: Glucose-Capillary: 106 mg/dL — ABNORMAL HIGH (ref 70–99)

## 2023-08-05 SURGERY — ESOPHAGOGASTRODUODENOSCOPY (EGD) WITH PROPOFOL
Anesthesia: General

## 2023-08-05 MED ORDER — LIDOCAINE HCL 1 % IJ SOLN
INTRAMUSCULAR | Status: DC | PRN
  Administered 2023-08-05: 50 mg via INTRADERMAL

## 2023-08-05 MED ORDER — PROPOFOL 10 MG/ML IV BOLUS
INTRAVENOUS | Status: DC | PRN
  Administered 2023-08-05: 100 mg via INTRAVENOUS
  Administered 2023-08-05 (×3): 20 mg via INTRAVENOUS

## 2023-08-05 MED ORDER — LACTATED RINGERS IV SOLN: INTRAVENOUS | Status: DC | PRN

## 2023-08-05 NOTE — H&P (Signed)
Primary Care Physician:  Benita Stabile, MD Primary Gastroenterologist:  Dr. Tasia Catchings  Pre-Procedure History & Physical: HPI: [name] is a 82 y.o. male with a history of Parkinson's, diabetes, HTN, stroke x2 on Plavix, prostate cancer, MI s/p CABG x3 in 1999, and vitamin D deficiency presenting today for evaluation of dysphagia/regurgitation.   atient had intermittent symptoms for quite some time however wife feels like this is getting a little bit more frequent, usually occurring about 1-2 times per week that at times is followed by regurgitation of food and hiccups. Denies any traditional heartburn symptoms. \   Past Medical History:  Diagnosis Date   Abnormal gait    Arthritis    Diabetes mellitus without complication (HCC)    Fatigue    GERD (gastroesophageal reflux disease)    Hypertension    Macular degeneration    Myocardial infarction (HCC)    Parkinson disease (HCC)    Prostate cancer (HCC)    Stroke [facility])    Tremor    Vitamin D deficiency     Past Surgical History:  Procedure Laterality Date   CARDIAC SURGERY     bypass   COLONOSCOPY N/A 02/09/2017   Procedure: COLONOSCOPY;  Surgeon: Franky Macho, MD;  Location: [facility];  Service: Gastroenterology;  Laterality: N/A;   CORONARY ARTERY BYPASS GRAFT     HEMORROIDECTOMY     KNEE SURGERY Right     Prior to Admission medications   Medication Sig Start Date End Date Taking? Authorizing Provider  amLODipine-benazepril (LOTREL) 5-20 MG capsule Take 1 capsule by mouth daily. 12/10/16   [provider]  carbidopa-levodopa (SINEMET IR) 25-100 MG tablet Take 2 tablets by mouth 3 (three) times daily. 05/13/23   Glean Salvo, NP  cetirizine (ZYRTEC) 10 MG tablet Take 10 mg by mouth daily as needed for allergies.    [provider]  clopidogrel (PLAVIX) 75 MG tablet Take 1 tablet (75 mg total) by mouth daily. 05/13/23   Glean Salvo, NP  ezetimibe (ZETIA) 10 MG tablet Take 1 tablet (10 mg total) by  mouth daily. 01/11/23   Mallipeddi, Vishnu P, MD  Lancets (UNILET MICRO-THIN 33G) MISC USE TO test TWICE DAILY 02/17/23   [provider]  Multiple Vitamins-Minerals (PRESERVISION AREDS 2+MULTI VIT) CAPS Take 1 tablet by mouth in the morning and at bedtime.    [provider]  nitroGLYCERIN (NITROSTAT) 0.4 MG SL tablet DISSOLVE 1 TABLET UNDER THE TONGUE EVERY 5 MINUTES AS NEEDED FOR CHEST PAIN. DO NOT EXCEED A TOTAL OF 3 DOSES IN 15 MINUTES.    [provider]  [employer] ULTRA test strip  04/05/23   [provider]  simvastatin (ZOCOR) 10 MG tablet Take 10 mg by mouth daily.    [provider]    Allergies as of 07/12/2023   (No Known Allergies)    Family History  Problem Relation Age of Onset   Dementia Mother    Diabetes Father    Lung cancer Father    Breast cancer Sister    Multiple myeloma Brother     Social History   Socioeconomic History   Marital status: Married    Spouse name: Not on file   Number of children: 1   Years of education: 12   Highest education level: High school graduate  Occupational History   Occupation: Retired  Tobacco Use   Smoking status: Former    Current packs/day: 0.00    Average packs/day: 1 pack/day  for 9.0 years (9.0 ttl pk-yrs)    Types: Cigarettes    Start date: 02/24/1962    Quit date: 02/25/1971    Years since quitting: 52.4    Passive exposure: Never   Smokeless tobacco: Former    Types: Snuff  Vaping Use   Vaping status: Never Used  Substance and Sexual Activity   Alcohol use: No    Alcohol/week: 0.0 standard drinks of alcohol   Drug use: No   Sexual activity: Not on file  Other Topics Concern   Not on file  Social History Narrative   Lives at home with his wife.   Right-handed.   2 cups caffeine per day.   Social Determinants of Health   Financial Resource Strain: Not on file  Food Insecurity: Not on file  Transportation Needs: Not on file  Physical Activity: Not on file   Stress: Not on file  Social Connections: Not on file  Intimate Partner Violence: Not on file    Review of Systems: See HPI, otherwise negative ROS  Physical Exam: Vital signs in last 24 hours:     General:   Alert,  Well-developed, well-nourished, pleasant and cooperative in NAD Head:  Normocephalic and atraumatic. Eyes:  Sclera clear, no icterus.   Conjunctiva pink. Ears:  Normal auditory acuity. Nose:  No deformity, discharge,  or lesions. Msk:  Symmetrical without gross deformities. Normal posture. Extremities:  Without clubbing or edema. Neurologic:  Alert and  oriented x4;  grossly normal neurologically. Skin:  Intact without significant lesions or rashes. Psych:  Alert and cooperative. Normal mood and affect.  Impression/Plan: [name] is a 82 y.o. male with a history of Parkinson's, diabetes, HTN, stroke x2 on Plavix, prostate cancer, MI s/p CABG x3 in 1999, and vitamin D deficiency presenting today for evaluation of dysphagia/regurgitation. Proceed with EGD with biopsies +/- dilation  The risks of the procedure including infection, bleed, or perforation as well as benefits, limitations, alternatives and imponderables have been reviewed with the patient. Questions have been answered. All parties agreeable.

## 2023-08-05 NOTE — Telephone Encounter (Signed)
Can we please call and see if we can get a read on this gentleman's CT scan. It was done 2 weeks ago and still not read.   Brooke Bonito, MSN, APRN, FNP-BC, AGACNP-BC [facility]

## 2023-08-05 NOTE — Anesthesia Preprocedure Evaluation (Addendum)
Anesthesia Evaluation  Patient identified by MRN, date of birth, ID band Patient awake    Reviewed: Allergy & Precautions, H&P , NPO status , Patient's Chart, lab work & pertinent test results, reviewed documented beta blocker date and time   Airway Mallampati: II  TM Distance: >3 FB Neck ROM: full    Dental no notable dental hx. (+) Dental Advisory Given, Teeth Intact   Pulmonary sleep apnea , former smoker   Pulmonary exam normal breath sounds clear to auscultation       Cardiovascular Exercise Tolerance: Good hypertension, + CAD and + Past MI  Normal cardiovascular exam Rhythm:regular Rate:Normal  Echo 50% EF with grade 1 diastolic dysfunction   Neuro/Psych CVA  negative psych ROS   GI/Hepatic Neg liver ROS,GERD  ,,  Endo/Other  diabetes, Type 2    Renal/GU negative Renal ROS  negative genitourinary   Musculoskeletal  (+) Arthritis , Osteoarthritis,    Abdominal   Peds  Hematology negative hematology ROS (+)   Anesthesia Other Findings   Reproductive/Obstetrics negative OB ROS                             Anesthesia Physical Anesthesia Plan  ASA: 2  Anesthesia Plan: General   Post-op Pain Management:    Induction: Intravenous  PONV Risk Score and Plan: Propofol infusion  Airway Management Planned: Nasal Cannula and Natural Airway  Additional Equipment: None  Intra-op Plan:   Post-operative Plan:   Informed Consent: I have reviewed the patients History and Physical, chart, labs and discussed the procedure including the risks, benefits and alternatives for the proposed anesthesia with the patient or authorized representative who has indicated his/her understanding and acceptance.     Dental Advisory Given  Plan Discussed with: CRNA  Anesthesia Plan Comments:        Anesthesia Quick Evaluation

## 2023-08-05 NOTE — Op Note (Addendum)
[facility] Patient Name: [name] Procedure Date: 08/05/2023 9:51 AM MRN: [id number] Date of Birth: Oct 09, 1940 Attending MD: Sanjuan Dame , MD, [id number] CSN: [id number] Age: 82 Admit Type: Outpatient Procedure:                Upper GI endoscopy Indications:              Dysphagia, Odynophagia Providers:                Sanjuan Dame, MD, Angelica Ran, Lennice Sites                            Technician, Technician Referring MD:              Medicines:                Monitored Anesthesia Care Complications:            No immediate complications. Estimated Blood Loss:     Estimated blood loss was minimal. Procedure:                Pre-Anesthesia Assessment:                           - Prior to the procedure, a History and Physical                            was performed, and patient medications and                            allergies were reviewed. The patient's tolerance of                            previous anesthesia was also reviewed. The risks                            and benefits of the procedure and the sedation                            options and risks were discussed with the patient.                            All questions were answered, and informed consent                            was obtained. Prior Anticoagulants: The patient has                            taken Plavix (clopidogrel), last dose was 5 days                            prior to procedure. ASA Grade Assessment: III - A                            patient with severe systemic disease. After  reviewing the risks and benefits, the patient was                            deemed in satisfactory condition to undergo the                            procedure.                           After obtaining informed consent, the endoscope was                            passed under direct vision. Throughout the                            procedure, the patient's blood pressure,  pulse, and                            oxygen saturations were monitored continuously. The                            [id number]-[id number] ([id number]) scope was introduced through the                            mouth, and advanced to the second part of duodenum.                            The upper GI endoscopy was accomplished without                            difficulty. The patient tolerated the procedure                            well. Scope In: 10:09:23 AM Scope Out: 10:18:04 AM Total Procedure Duration: 0 hours 8 minutes 41 seconds  Findings:      White nummular lesions were noted in the middle third of the esophagus.       Biopsies were taken with a cold forceps for histology.      No endoscopic abnormality was evident in the esophagus to explain the       patient's complaint of dysphagia. It was decided, however, to proceed       with dilation of the entire esophagus. A guidewire was placed and the       scope was withdrawn. Dilation was performed with a Savary dilator with       mild resistance at 16 mm. The dilation site was examined following       endoscope reinsertion and showed mild mucosal disruption. Biopsies were       obtained from the proximal and distal esophagus with cold forceps for       histology of suspected eosinophilic esophagitis.      Mildly erythematous mucosa without bleeding was found in the gastric       antrum. Biopsies were taken with a cold forceps for histology.      The duodenal bulb and second portion of the duodenum were normal. Impression:               -  White nummular lesions in esophageal mucosa.                            Biopsied.                           - No endoscopic esophageal abnormality to explain                            patient's dysphagia. Esophagus dilated. Dilated.                           - Erythematous mucosa in the antrum. Biopsied.                           - Normal duodenal bulb and second portion of the                             duodenum.                           - Biopsies were taken with a cold forceps for                            evaluation of eosinophilic esophagitis. Moderate Sedation:      Per Anesthesia Care Recommendation:           - Patient has a contact number available for                            emergencies. The signs and symptoms of potential                            delayed complications were discussed with the                            patient. Return to normal activities tomorrow.                            Written discharge instructions were provided to the                            patient.                           - Resume previous diet.                           - Continue present medications.                           - Await pathology results.                           -Obtain Swallow exam, modfied barium swallow                           -  Follow up CT Abdomen results which are pendig                           -Follow up in GI clinic Procedure Code(s):        --- Professional ---                           [phone], Esophagogastroduodenoscopy, flexible,                            transoral; with insertion of guide wire followed by                            passage of dilator(s) through esophagus over guide                            wire                           43239, 59, Esophagogastroduodenoscopy, flexible,                            transoral; with biopsy, single or multiple Diagnosis Code(s):        --- Professional ---                           K22.89, Other specified disease of esophagus                           R13.10, Dysphagia, unspecified                           K31.89, Other diseases of stomach and duodenum CPT copyright 2022 American Medical Association. All rights reserved. The codes documented in this report are preliminary and upon coder review may  be revised to meet current compliance requirements. Sanjuan Dame, MD Sanjuan Dame, MD 08/05/2023 10:27:16  AM This report has been signed electronically. Number of Addenda: 0

## 2023-08-05 NOTE — Discharge Instructions (Signed)

## 2023-08-05 NOTE — Anesthesia Postprocedure Evaluation (Signed)
Anesthesia Post Note  Patient: Keyvion Eschete Scurlock  Procedure(s) Performed: ESOPHAGOGASTRODUODENOSCOPY (EGD) WITH PROPOFOL ESOPHAGEAL DILATION BIOPSY  Patient location during evaluation: Short Stay Anesthesia Type: General Level of consciousness: awake and alert Pain management: pain level controlled Vital Signs Assessment: post-procedure vital signs reviewed and stable Respiratory status: spontaneous breathing Cardiovascular status: blood pressure returned to baseline and stable Postop Assessment: no apparent nausea or vomiting Anesthetic complications: no   No notable events documented.   Last Vitals:  Vitals:   08/05/23 0912  BP: 137/73  Pulse: 89  Resp: (!) 21  Temp: 36.4 C  SpO2: 99%    Last Pain:  Vitals:   08/05/23 1003  TempSrc:   PainSc: 0-No pain                 Juliani Laduke

## 2023-08-05 NOTE — Transfer of Care (Signed)
Immediate Anesthesia Transfer of Care Note  Patient: [name]  Procedure(s) Performed: ESOPHAGOGASTRODUODENOSCOPY (EGD) WITH PROPOFOL ESOPHAGEAL DILATION BIOPSY  Patient Location: Short Stay  Anesthesia Type:General  Level of Consciousness: awake  Airway & Oxygen Therapy: Patient Spontanous Breathing  Post-op Assessment: Report given to RN  Post vital signs: Reviewed and stable  Last Vitals:  Vitals Value Taken Time  BP    Temp    Pulse    Resp    SpO2      Last Pain:  Vitals:   08/05/23 1003  TempSrc:   PainSc: 0-No pain      Patients Stated Pain Goal: 5 (08/05/23 0912)  Complications: No notable events documented.

## 2023-08-05 NOTE — Telephone Encounter (Signed)
I have called radiology and asked for this to be read ASAP. They stated it should be done by tomorrow.

## 2023-08-09 LAB — SURGICAL PATHOLOGY

## 2023-08-10 DIAGNOSIS — M79675 Pain in left toe(s): Secondary | ICD-10-CM | POA: Diagnosis not present

## 2023-08-10 DIAGNOSIS — M79674 Pain in right toe(s): Secondary | ICD-10-CM | POA: Diagnosis not present

## 2023-08-10 DIAGNOSIS — L110 Acquired keratosis follicularis: Secondary | ICD-10-CM | POA: Diagnosis not present

## 2023-08-10 DIAGNOSIS — M79672 Pain in left foot: Secondary | ICD-10-CM | POA: Diagnosis not present

## 2023-08-10 DIAGNOSIS — I739 Peripheral vascular disease, unspecified: Secondary | ICD-10-CM | POA: Diagnosis not present

## 2023-08-10 DIAGNOSIS — M79671 Pain in right foot: Secondary | ICD-10-CM | POA: Diagnosis not present

## 2023-08-10 DIAGNOSIS — E1140 Type 2 diabetes mellitus with diabetic neuropathy, unspecified: Secondary | ICD-10-CM | POA: Diagnosis not present

## 2023-08-11 ENCOUNTER — Encounter (HOSPITAL_COMMUNITY): Payer: Self-pay | Admitting: Gastroenterology

## 2023-08-12 NOTE — Progress Notes (Signed)
I reviewed the pathology results. Ann, can you send her a letter with the findings as described below please?  Thanks,  Vista Lawman, MD Gastroenterology and Hepatology [facility]  ---------------------------------------------------------------------------------------------  [facility] [address], [place], [place] [address] Phone:  [phone]   08/12/23 [place], [place]   Dear Otis Peak,  I am writing to inform you that the biopsies taken during your recent endoscopic examination showed:  No H. Pylori bacteria in stomach , or any early cancer changes to the stomach mucosa ( Intestinal metaplasia)   Normal biopsies of the food-pipe ( no eosinophilic esophagitis )   Please continue to follow-up in the GI clinic, you may need Swallow exam, modfied barium swallow  Please call us at [phone] if you have persistent problems or have questions about your condition that have not been fully answered at this time.  Sincerely,  Vista Lawman, MD Gastroenterology and Hepatology

## 2023-08-17 ENCOUNTER — Encounter: Payer: Self-pay | Admitting: *Deleted

## 2023-08-17 ENCOUNTER — Encounter (INDEPENDENT_AMBULATORY_CARE_PROVIDER_SITE_OTHER): Payer: Self-pay | Admitting: *Deleted

## 2023-09-02 ENCOUNTER — Ambulatory Visit (HOSPITAL_COMMUNITY)
Admission: RE | Admit: 2023-09-02 | Discharge: 2023-09-02 | Disposition: A | Discharge status: Home / Self Care | Payer: Medicare Other | Source: Ambulatory Visit | Attending: Gastroenterology | Admitting: Gastroenterology

## 2023-09-02 DIAGNOSIS — R0989 Other specified symptoms and signs involving the circulatory and respiratory systems: Secondary | ICD-10-CM | POA: Diagnosis not present

## 2023-09-02 DIAGNOSIS — R1310 Dysphagia, unspecified: Secondary | ICD-10-CM | POA: Diagnosis not present

## 2023-09-02 DIAGNOSIS — R634 Abnormal weight loss: Secondary | ICD-10-CM | POA: Insufficient documentation

## 2023-09-05 NOTE — Progress Notes (Unsigned)
GI Office Note    Referring Provider: Benita Stabile, MD Primary Care Physician:  Benita Stabile, MD Primary Gastroenterologist: Vista Lawman, MD  Date:  09/06/2023  ID:  [name], DOB 1941/04/11, MRN [id number]   Chief Complaint   Chief Complaint  Patient presents with   Follow-up    Pt arrives for follow up. Pt still having issues with eating. No improvement since EGD on 08/05/23. Bowels are black most of time. No complaints of abdominal pain. Possible colonoscopy needed.?   History of Present Illness  [name] is a 82 y.o. male with a history of diabetes, stroke x2, HTN, prostate cancer s/p seed placement, MI s/p CABG x3, and vitamin D deficiency presenting today for follow up of weight loss and dysphagia.  Colonoscopy in 2018 Dr. Lovell Sheehan: -2 mm polyp in cecum -3 mm polyp in mid transverse -sigmoid diverticulosis -Recommended 5 years   OV 04/08/23. Patient reported few episodes of food getting stuck at the top of his chest and this is occurring 1-2 times per weeks. Will get the hiccups and then sometimes vomit. Wife reported he was complaining of lots of chest pressure when this occurs. Has a good appetite but will stop eating once he symptoms occur. Sometimes regurgitates up food/vomits. Wife reported a 40 pound weight loss over the last year and 1/2 to 2 years. No history of upper endoscopy. Denies any constipation, diarrhea, melena, or BRBPR.  Labs 04/19/2023: CRP, TSH, T4 normal   BPE 04/19/23: -Barium tablet passed normally -No significant abnormality identified -No esophageal reflux -Offered MBSS  Last office visit 06/14/23.Still has globus sensation that can lead to vomiting. Wife states it usually happens in the mornings at breakfast and no other time. Did not notice any change with omeprazole so he stopped. Still seeing neurologist regarding parkinson's. MBSS ordered. EGD scheduled. CT and CXR ordered. Increase protein intake recommended.   Labs  07/09/23: Unremarkable CMP.  Hemoglobin stable at 12.8, platelets 289, MCV 95. TSH within normal limits. A1c 5.8. Total cholesterol 98. Normal triglycerides, HDL, and LDL.  EGD 08/05/23: -white nummular lesions in middle third of esophagus s/p biopsy -normal esophagus s/p empiric dilation (mild mucosal disruption) -mild gastritis in antrum s/p biopsy -normal duodenum -advised to continue with MBSS -path: mild chronic gastritis (neg H. Pylori). Mild reactive esophageal mucosa changes. No fungal strains.   CT A/P 08/05/23: -Cholelithiasis (unchanged) -normal pancreas -calculus in upper pole right kidney -no dilated small bowel -normal appendix -large volume of stool throughout the colon -redundant sigmoid colon -brachytherapy seeds in the prostate  CXR 12/12/24FINDINGS: Cardiomediastinal silhouette unchanged in size and contour. No evidence of central vascular congestion. No interlobular septal thickening.Low lung volumes.Surgical changes of median sternotomy and CABG. No pneumothorax or pleural effusion. Coarsened interstitial markings, with no confluent airspace disease. No acute displaced fracture. Degenerative changes of the spine.Osteopenia   IMPRESSION: Negative for acute cardiopulmonary disease  MBSS not performed.   Today: Dysphagia -Usually just food that gets stuck mid chest and has to eat very slow. If he eats too fast he gets the hiccups. When he first starts with chewing he does not have much trouble swallowing. They state there was some mild improvement with dilation but then seemed like it was worse than before.   Weight loss - When he can get it down he likes to eat. The appetite is not the issue.   No issues Has no issues wit constipation, diarrhea, straining. They do report some black stools.  Does not occur with every bowel movements. Wife states thi sis about 8/10 times.   Wife states it is best to call her or send her messages to schedule things. She does not have  voicemail.   No chest pain or shortness of breath.   Wt Readings from Last 3 Encounters:  09/06/23 159 lb 11.2 oz (72.4 kg)  08/05/23 163 lb 9.3 oz (74.2 kg)  08/02/23 163 lb 12.8 oz (74.3 kg)  07/12/23 163 lb 06/14/23 163 lb  Current Outpatient Medications  Medication Sig Dispense Refill   amLODipine-benazepril (LOTREL) 5-20 MG capsule Take 1 capsule by mouth daily.     carbidopa-levodopa (SINEMET IR) 25-100 MG tablet Take 2 tablets by mouth 3 (three) times daily. 540 tablet 3   cetirizine (ZYRTEC) 10 MG tablet Take 10 mg by mouth daily as needed for allergies.     clopidogrel (PLAVIX) 75 MG tablet Take 1 tablet (75 mg total) by mouth daily. 90 tablet 4   ezetimibe (ZETIA) 10 MG tablet Take 1 tablet (10 mg total) by mouth daily. 90 tablet 3   Lancets (UNILET MICRO-THIN 33G) MISC USE TO test TWICE DAILY     Multiple Vitamins-Minerals (PRESERVISION AREDS 2+MULTI VIT) CAPS Take 1 tablet by mouth in the morning and at bedtime.     nitroGLYCERIN (NITROSTAT) 0.4 MG SL tablet DISSOLVE 1 TABLET UNDER THE TONGUE EVERY 5 MINUTES AS NEEDED FOR CHEST PAIN. DO NOT EXCEED A TOTAL OF 3 DOSES IN 15 MINUTES.     ONETOUCH ULTRA test strip      simvastatin (ZOCOR) 10 MG tablet Take 10 mg by mouth daily.     No current facility-administered medications for this visit.    Past Medical History:  Diagnosis Date   Abnormal gait    Arthritis    Diabetes mellitus without complication (HCC)    Fatigue    GERD (gastroesophageal reflux disease)    Hypertension    Macular degeneration    Myocardial infarction (HCC)    Parkinson disease (HCC)    Prostate cancer (HCC)    Stroke [facility])    Tremor    Vitamin D deficiency     Past Surgical History:  Procedure Laterality Date   BIOPSY  08/05/2023   Procedure: BIOPSY;  Surgeon: Franky Macho, MD;  [facility]: [facility];  Service: Endoscopy;;   CARDIAC SURGERY     bypass   COLONOSCOPY N/A 02/09/2017   Procedure: COLONOSCOPY;  Surgeon: Franky Macho,  MD;  Location: [facility];  Service: Gastroenterology;  Laterality: N/A;   CORONARY ARTERY BYPASS GRAFT     ESOPHAGEAL DILATION N/A 08/05/2023   Procedure: ESOPHAGEAL DILATION;  Surgeon: Franky Macho, MD;  Location: [facility];  Service: Endoscopy;  Laterality: N/A;   ESOPHAGOGASTRODUODENOSCOPY (EGD) WITH PROPOFOL N/A 08/05/2023   Procedure: ESOPHAGOGASTRODUODENOSCOPY (EGD) WITH PROPOFOL;  Surgeon: Franky Macho, MD;  Location: [facility];  Service: Endoscopy;  Laterality: N/A;  1045AM, ASA 3   HEMORROIDECTOMY     KNEE SURGERY Right     Family History  Problem Relation Age of Onset   Dementia Mother    Diabetes Father    Lung cancer Father    Breast cancer Sister    Multiple myeloma Brother     Allergies as of 09/06/2023   (No Known Allergies)    Social History   Socioeconomic History   Marital status: Married    Spouse name: Not on file   Number of children: 1  Years of education: 2   Highest education level: High school graduate  Occupational History   Occupation: Retired  Tobacco Use   Smoking status: Former    Current packs/day: 0.00    Average packs/day: 1 pack/day for 9.0 years (9.0 ttl pk-yrs)    Types: Cigarettes    Start date: 02/24/1962    Quit date: 02/25/1971    Years since quitting: 52.5    Passive exposure: Never   Smokeless tobacco: Former    Types: Snuff  Vaping Use   Vaping status: Never Used  Substance and Sexual Activity   Alcohol use: No    Alcohol/week: 0.0 standard drinks of alcohol   Drug use: No   Sexual activity: Not on file  Other Topics Concern   Not on file  Social History Narrative   Lives at home with his wife.   Right-handed.   2 cups caffeine per day.   Social Drivers of Corporate investment banker Strain: Not on file  Food Insecurity: Not on file  Transportation Needs: Not on file  Physical Activity: Not on file  Stress: Not on file  Social Connections: Not on file   Review of Systems   Gen: +  weakness fatigue, weight loss. Denies fever, chills, anorexia. CV: Denies chest pain, palpitations, syncope, peripheral edema, and claudication. Resp: Denies dyspnea at rest, cough, wheezing, coughing up blood, and pleurisy. GI: See HPI Derm: + dry skin. Denies rash, itching Psych: + confusion. Denies depression, anxiety, memory loss, No homicidal or suicidal ideation.  Heme: + bleeding. Denies enlarged lymph nodes.  Physical Exam   BP (!) 146/81   Pulse 83   Temp 97.7 F (36.5 C)   Ht 5\' 9"  (1.753 m)   Wt 159 lb 11.2 oz (72.4 kg)   BMI 23.58 kg/m   General:   Alert and oriented. No distress noted. Pleasant and cooperative. Thin appearing.  Head:  Normocephalic and atraumatic. Eyes:  Conjuctiva clear without scleral icterus. Mouth:  Oral mucosa pink and moist. Good dentition. No lesions. Lungs:  Clear to auscultation bilaterally. No wheezes, rales, or rhonchi. No distress.  Heart:  S1, S2 present without murmurs appreciated.  Abdomen:  +BS, soft, non-tender and non-distended. No rebound or guarding. No HSM or masses noted. Rectal: deferred Msk:  weak, in wheelchair.  Extremities:  Without edema. Thin skin.  Neurologic:  Alert and  oriented x4 Psych:  Alert and cooperative. Normal mood and affect.  Assessment  [name] is a 82 y.o. male with a history of diabetes, stroke x2, HTN, prostate cancer s/p seed placement, MI s/p CABG x3, and vitamin D deficiency presenting today for follow up of weight loss and dysphagia.   Dysphagia: EGD with nummular white lesions in the esophagus and mild antral gastritis, empiric dilation performed.  Continues to have issues with solids, feeling stuck mid chest.  This could be secondary to his Parkinson's given he feels like food moves very slow through his esophagus.  Previously recommended MBSS which has not yet been performed.  We will attempt to reschedule this and if unrevealing will consider manometry.  He denies any acid reflux episodes  and has not noted improvement with omeprazole in the past so it was discontinued.  Weight loss, black stool: Previously reported a 40 pound weight loss over the last 1.52 years.  Per our scale today he has lost another 4 pounds since his last office visit however overall his weight has been very stable over the last  6 months.  CT A/P without abnormality other than constipation.  Chest x-ray without any abnormality noted.  Earlier this year his thyroid function and CRP were all within normal limits as well.  This could be secondary to his Parkinson's disease and his dysphagia.  For now recommended a high-protein/high-calorie diet with protein supplementation.  He has not had a colonoscopy since 2018 and does have a history of colon polyps.  Given some reports of black stool this could be related to constipation only but he is on anticoagulation.  He was recommended to have a 5-year repeat after colonoscopy in 2018 and he is overdue, has been resistant to colonoscopy in the past but now agreeable.  Constipation: Recent CT with stool filled colon.  Recommended MiraLAX 17 g once nightly.   PLAN   MBSS Manometry if MBSS normal. CBC, iron panel MiraLAX 17 g once nightly Protein shake nightly. Use powder mixed with liquid of choice.  Proceed with colonoscopy with propofol by Dr. Tasia Catchings in near future: the risks, benefits, and alternatives have been discussed with the patient in detail. The patient states understanding and desires to proceed. ASA 3 Clearance to hold plavix for 5 days.  Linzess 145 mcg daily for 4 days prior Follow up in 8 weeks.    Brooke Bonito, MSN, FNP-BC, AGACNP-BC [facility]

## 2023-09-06 ENCOUNTER — Encounter: Payer: Self-pay | Admitting: Gastroenterology

## 2023-09-06 ENCOUNTER — Ambulatory Visit: Payer: Medicare Other | Admitting: Gastroenterology

## 2023-09-06 ENCOUNTER — Telehealth (INDEPENDENT_AMBULATORY_CARE_PROVIDER_SITE_OTHER): Payer: Self-pay | Admitting: Gastroenterology

## 2023-09-06 VITALS — BP 146/81 | HR 83 | Temp 97.7°F | Ht 69.0 in | Wt 159.7 lb

## 2023-09-06 DIAGNOSIS — R1310 Dysphagia, unspecified: Secondary | ICD-10-CM | POA: Diagnosis not present

## 2023-09-06 DIAGNOSIS — K5900 Constipation, unspecified: Secondary | ICD-10-CM | POA: Diagnosis not present

## 2023-09-06 DIAGNOSIS — K921 Melena: Secondary | ICD-10-CM | POA: Diagnosis not present

## 2023-09-06 DIAGNOSIS — R634 Abnormal weight loss: Secondary | ICD-10-CM | POA: Diagnosis not present

## 2023-09-06 MED ORDER — PEG 3350-KCL-NA BICARB-NACL 420 G PO SOLR: 4000.0000 mL | Freq: Once | ORAL | 0 refills | Status: AC

## 2023-09-06 NOTE — Telephone Encounter (Signed)
    09/06/23  [name] 1941/01/27  What type of surgery is being performed? Colonoscopy   When is surgery scheduled? 10/14/23  Clearance to hold Plavix 5 days pror. Clearance was given back in October for EGD; double checking to make sure it is still OK to hold  Name of physician performing surgery?  Dr. Starleen Arms [facility]astroenterology at [facility] Phone: [phone] Fax: [phone]  Anethesia type (none, local, MAC, general)? MAC

## 2023-09-06 NOTE — Patient Instructions (Addendum)
For swallowing issues: We will attempt to get you rescheduled for the bona fide barium swallow study. Continue taking small bites and alternating with sips of liquids and taking it slow as you have been. If nothing found with modified barium swallow study then we can pursue manometry.  For anemia, black stools: Please let me know if this becomes tarry looking or shiny. Avoid any ibuprofen, Aleve, Advil, or BC or Goody powders. We will check labs including CBC and iron panel.  He can asked Dr. Margo Aye if they will perform these, if not you may go to [facility].   For weight loss: Follow a high-protein diet. Start with at least 1 protein shake daily.  I would recommend you get a powder mix that has about 25 g or more per serving.  You can add [place] yogurt to this to add a little extra protein.  You can mix with milk, almond milk, or water but never is your preference. We will get you scheduled  For constipation: Start MiraLAX 17 g once daily in 8 ounces of fluid of your choice.  Follow-up in 8 weeks.  I hope you have a [place] Christmas and a happy new year  It was a pleasure to see you today. I want to create trusting relationships with patients. If you receive a survey regarding your visit,  I greatly appreciate you taking time to fill this out on paper or through your MyChart. I value your feedback.  Brooke Bonito, MSN, FNP-BC, AGACNP-BC [facility]

## 2023-09-06 NOTE — Addendum Note (Signed)
Addended by: Marlowe Shores on: 09/06/2023 03:48 PM   Modules accepted: Orders

## 2023-09-06 NOTE — Telephone Encounter (Signed)
   Patient Name: [name]  DOB: Nov 08, 1940 MRN: [id number]  Primary Cardiologist: Marjo Bicker, MD  Chart reviewed as part of pre-operative protocol coverage. Given past medical history and time since last visit, based on ACC/AHA guidelines, ELIASAR POMYKALA is at acceptable risk for the planned procedure without further cardiovascular testing.   Per Dr. Jenene Slicker, "Plavix can be held for 5 days prior to procedure and resume after the procedure when safe from bleeding standpoint. In the interim, can substitute Plavix with aspirin 81 mg once daily for 5 days prior to procedure."  I will route this recommendation to the requesting party via Epic fax function and remove from pre-op pool.  Please call with questions.  Joylene Grapes, NP 09/06/2023, 4:03 PM

## 2023-09-09 DIAGNOSIS — R1310 Dysphagia, unspecified: Secondary | ICD-10-CM | POA: Diagnosis not present

## 2023-09-09 DIAGNOSIS — R627 Adult failure to thrive: Secondary | ICD-10-CM | POA: Diagnosis not present

## 2023-09-23 ENCOUNTER — Telehealth (INDEPENDENT_AMBULATORY_CARE_PROVIDER_SITE_OTHER): Payer: Self-pay | Admitting: Gastroenterology

## 2023-09-23 NOTE — Telephone Encounter (Signed)
 No PA needed via [facility]

## 2023-09-27 ENCOUNTER — Telehealth (INDEPENDENT_AMBULATORY_CARE_PROVIDER_SITE_OTHER): Payer: Self-pay | Admitting: Gastroenterology

## 2023-09-27 NOTE — Telephone Encounter (Signed)
 Pt wife left message to cancel TCS. Returned call to pt wife. Wife states that pt is having a hard time swallowing and is going down hill fast and she does not want to put him through anything he does not want to. Pt also does not want TCS.

## 2023-09-29 DIAGNOSIS — K5900 Constipation, unspecified: Secondary | ICD-10-CM | POA: Diagnosis not present

## 2023-09-29 DIAGNOSIS — R600 Localized edema: Secondary | ICD-10-CM | POA: Diagnosis not present

## 2023-09-29 DIAGNOSIS — R1310 Dysphagia, unspecified: Secondary | ICD-10-CM | POA: Diagnosis not present

## 2023-09-29 DIAGNOSIS — D649 Anemia, unspecified: Secondary | ICD-10-CM | POA: Diagnosis not present

## 2023-09-29 DIAGNOSIS — Z6822 Body mass index (BMI) 22.0-22.9, adult: Secondary | ICD-10-CM | POA: Diagnosis not present

## 2023-09-29 DIAGNOSIS — Z713 Dietary counseling and surveillance: Secondary | ICD-10-CM | POA: Diagnosis not present

## 2023-09-29 DIAGNOSIS — Z79899 Other long term (current) drug therapy: Secondary | ICD-10-CM | POA: Diagnosis not present

## 2023-09-29 DIAGNOSIS — R627 Adult failure to thrive: Secondary | ICD-10-CM | POA: Diagnosis not present

## 2023-09-29 DIAGNOSIS — I10 Essential (primary) hypertension: Secondary | ICD-10-CM | POA: Diagnosis not present

## 2023-10-07 ENCOUNTER — Other Ambulatory Visit (HOSPITAL_COMMUNITY): Payer: Medicare Other

## 2023-10-14 ENCOUNTER — Ambulatory Visit (HOSPITAL_COMMUNITY): Admit: 2023-10-14 | Payer: Medicare Other

## 2023-10-14 ENCOUNTER — Encounter (HOSPITAL_COMMUNITY): Payer: Self-pay

## 2023-10-14 SURGERY — COLONOSCOPY WITH PROPOFOL
Anesthesia: Monitor Anesthesia Care

## 2023-11-01 ENCOUNTER — Ambulatory Visit: Payer: Medicare Other | Admitting: Gastroenterology

## 2023-12-01 NOTE — Progress Notes (Deleted)
 PRIMARY NEUROLOGIST: Dr. Terrace Arabia   ASSESSMENT AND PLAN  1.  Parkinson's disease 2.  History of stroke  -Adjust Sinemet dosing 25/100 MG 2 tablets 3 times daily at 8 AM, 12 PM, 4 OR 5 PM, if needed, we may add in a fourth dose.  However, seems his gait abnormalities are also influenced by his sedentary lifestyle, deconditioning -Recommend increasing physical activity, is very sedentary.  Completed PT home health without much benefit -Continue workup with GI, monitoring weight loss -Continue Plavix 75 mg daily for secondary stroke prevention -Follow up in 6 months with me  Addendum 07/20/23 SS: Labs from PCP 07/09/23 TSH 1.060, A1c 5.8, LDL 37, cholesterol 98, creatinine 0.83, Hgb 12.8, platelet 289.  HISTORY: [name] is a 83 year old male, seen in request by his primary care physician Dr. Nita Sells for evaluation of gait abnormality, he is accompanied by his wife at today's clinical visit on November 07, 2019.   I have reviewed and summarized the referring note from the referring physician.  He had a past medical history of hypertension, hyperlipidemia, diabetes, coronary artery disease,   He was previously very active, "very physical", enjoying his yard work in the summertime, around November 2020, he noticed gradual onset gait abnormality, fell few times, acute worsening since he fell at the end of December 2020, he fell in the bathtub on his back, following that fall, he had 2 weeks history of urinary incontinence, now gradually improved, but his gait abnormality remains, he was noted to have small stride, careful, stiff gait,   He denied loss of sense of smell, denies REM sleep disorder or memory loss, denies hand tremor, denies bilateral upper or lower extremity paresthesia, denies significant pain, he denies double vision, swallowing difficulty, ptosis,   Laboratory evaluations in January 2021, normal CBC hemoglobin of 13.9, CMP, creatinine of 0.94, LDL was 68, A1c was 7.0,    UPDATE December 21 2019: He continue complains of unsteady gait, seems to have more trouble on his right side   MRI of brain in March 2021, small subacute lacunar infarction in the right cerebellum hemisphere,also chronic microvascular ischemic changes in the right middle cerebellar peduncle, left pons, subcortical, periventricular, deep white matter's,   MRI of cervical spine showed multilevel degenerative changes, but there was no evidence of spinal cord or canal stenosis.   Laboratory evaluation February 2021, normal B12, RPR, folic acid, C-reactive protein, TSH, CPK, ESR, ANA, acetylcholine receptor antibody   UPDATE April 23 2020: Snienemt has helped his walking, reported mild to moderate improvement, taking it at 9 AM, 1 PM, 6 PM, noticed elevated 30 minutes after medication, also noticed wearing off before the next dose, he is not ambulated with a cane, physical therapy was helpful as well   He denied loss sense of smell, no REM sleep disorder, no significant side effect from Sinemet dose to try higher dose   Echocardiogram showed ejection fraction 50 to 55%, no right-to-left shunt by bubble study Ultrasound of carotid artery showed less than 39% stenosis bilaterally, is taking Plavix 75 mg only   UPDATE May 12 2021: He fell in tub, had incontinence, quit driving in Feb 5284.  Now almost back to his baseline, but overall slow decline, He took sinemet 9am, 2pm, 6pm,  it does help him moves better, he noticed wearing off, also noticed worsening memory loss   Ultrasound of carotid artery May 2021 less than 39% stenosis bilaterally, antegrade flow bilateral vertebral artery   Echocardiogram in May  2021, no evidence of intracardiac shunting  Update November 12, 2021 SS: In ER [facility] 10/29/21 for gait abnormality, CT scan showed bilateral basal ganglia chronic lacunar infarcts, 10 mm hypodense focus right pons could be infarct or artifact? MRI was negative for acute infarct, did show  atrophy moderate SVD. Discharged to continue Plavix, statin. Since 2/8, feels weak generally, feels more shuffling with gait, trouble getting out of chair. Just started PT/OT this week at home. Is taking Sinemet 25/100, 2 in AM, 2 midday, 2 evening, 1.5 at bedtime. Azilect 1 mg daily. MMSE 28/30. 50% of his self since event. No falls. This reminds his wife of when he had his first stroke (called his wife in to the bedroom, felt couldn't move, dizzy)-same presentation this time. MMSE 28/30.  Labs from ER visit, showed urine with moderate Blood but no infection, CBC CMP showed no significant abnormality.   CTA head:   1. No intracranial large vessel occlusion is identified.  2. Intracranial atherosclerotic disease with multifocal stenoses,  most notably as follows.  3. Severe stenosis within the A3 right anterior cerebral artery.  4. Moderate stenosis within the A3 left anterior cerebral artery.  5. Severe stenosis within the left anterior cerebral artery at the  A3/A4 junction.  6. Severe stenosis within the right PCA at the P2/P3 junction.   UPDATE August 14th 2023: Getitng slower,g etting up from chair, sinement 9am, 1pm, 6pm, 10pm,  take 2 tabs Goes to sleep, gets up sleeps well, in one spot, use once at night,   He has side effect,   No difference. Empty stomch,   He spend most time sitting around.  Get the mail.  Update November 05, 2022 SS: has lost 9 lbs since August 2023. Has a good appetite. Walking is worsening, isn't picking up his feet, 4-5 falls. Using walker. Having bathroom accidents, he doesn't smell it, his wife overwhelmed with cleaning, has been going for several years. Has macular degeneration, vision is poor for ADLs. Takes Sinemet 25/100 mg 2 tables 3 times daily . He is sedentary. On metformin. He complains of nothing hurting. No blood loss.   Labs from PCP 12/19/2022 A1c 5.9, cholesterol 152, triglyceride 161, LDL 85, glucose 122, creatinine 0.83, AST 7, ALT 6, Hgb  13.7, platelet 303.  Update May 13, 2023 SS: seeing GI, food getting stuck in esophagus, gets burps, hiccups, vomiting, full feeling. Seeing GI, he refused endoscopy. Barium swallow was fine. Happens near daily. Moving slower, more falls, since last visit 6 falls, most in bathroom, lose balance. Using walker. He did home health PT, not much help. Remains on Sinemet 25/100 2 tablets 3 times daily (8, 12, 9 PM).  He is sedentary. Lost about 20 lbs in 1 year.  Has not yet taken his second dose of Sinemet, increased rigidity, bradykinesia right upper extremity.  Update December 02, 2023 SS   REVIEW OF SYSTEMS: Out of a complete 14 system review of symptoms, the patient complains only of the following symptoms, and all other reviewed systems are negative.  See HPI  ALLERGIES: No Known Allergies  HOME MEDICATIONS: Outpatient Medications Prior to Visit  Medication Sig Dispense Refill   amLODipine-benazepril (LOTREL) 5-20 MG capsule Take 1 capsule by mouth daily.     carbidopa-levodopa (SINEMET IR) 25-100 MG tablet Take 2 tablets by mouth 3 (three) times daily. 540 tablet 3   cetirizine (ZYRTEC) 10 MG tablet Take 10 mg by mouth daily as needed for allergies.  clopidogrel (PLAVIX) 75 MG tablet Take 1 tablet (75 mg total) by mouth daily. 90 tablet 4   ezetimibe (ZETIA) 10 MG tablet Take 1 tablet (10 mg total) by mouth daily. 90 tablet 3   Lancets (UNILET MICRO-THIN 33G) MISC USE TO test TWICE DAILY     Multiple Vitamins-Minerals (PRESERVISION AREDS 2+MULTI VIT) CAPS Take 1 tablet by mouth in the morning and at bedtime.     nitroGLYCERIN (NITROSTAT) 0.4 MG SL tablet DISSOLVE 1 TABLET UNDER THE TONGUE EVERY 5 MINUTES AS NEEDED FOR CHEST PAIN. DO NOT EXCEED A TOTAL OF 3 DOSES IN 15 MINUTES.     ONETOUCH ULTRA test strip      simvastatin (ZOCOR) 10 MG tablet Take 10 mg by mouth daily.     No facility-administered medications prior to visit.    PAST MEDICAL HISTORY: Past Medical History:   Diagnosis Date   Abnormal gait    Arthritis    Diabetes mellitus without complication (HCC)    Fatigue    GERD (gastroesophageal reflux disease)    Hypertension    Macular degeneration    Myocardial infarction (HCC)    Parkinson disease (HCC)    Prostate cancer (HCC)    Stroke (HCC)    Tremor    Vitamin D deficiency     PAST SURGICAL HISTORY: Past Surgical History:  Procedure Laterality Date   BIOPSY  08/05/2023   Procedure: BIOPSY;  Surgeon: Franky Macho, MD;  [facility]: [facility];  Service: Endoscopy;;   CARDIAC SURGERY     bypass   COLONOSCOPY N/A 02/09/2017   Procedure: COLONOSCOPY;  Surgeon: Franky Macho, MD;  Location: [facility];  Service: Gastroenterology;  Laterality: N/A;   CORONARY ARTERY BYPASS GRAFT     ESOPHAGEAL DILATION N/A 08/05/2023   Procedure: ESOPHAGEAL DILATION;  Surgeon: Franky Macho, MD;  [facility]: [facility];  Service: Endoscopy;  Laterality: N/A;   ESOPHAGOGASTRODUODENOSCOPY (EGD) WITH PROPOFOL N/A 08/05/2023   Procedure: ESOPHAGOGASTRODUODENOSCOPY (EGD) WITH PROPOFOL;  Surgeon: Franky Macho, MD;  Location: [facility];  Service: Endoscopy;  Laterality: N/A;  1045AM, ASA 3   HEMORROIDECTOMY     KNEE SURGERY Right     FAMILY HISTORY: Family History  Problem Relation Age of Onset   Dementia Mother    Diabetes Father    Lung cancer Father    Breast cancer Sister    Multiple myeloma Brother     SOCIAL HISTORY: Social History   Socioeconomic History   Marital status: Married    Spouse name: Not on file   Number of children: 1   Years of education: 12   Highest education level: High school graduate  Occupational History   Occupation: Retired  Tobacco Use   Smoking status: Former    Current packs/day: 0.00    Average packs/day: 1 pack/day for 9.0 years (9.0 ttl pk-yrs)    Types: Cigarettes    Start date: 02/24/1962    Quit date: 02/25/1971    Years since quitting: 52.8    Passive exposure: Never   Smokeless  tobacco: Former    Types: Snuff  Vaping Use   Vaping status: Never Used  Substance and Sexual Activity   Alcohol use: No    Alcohol/week: 0.0 standard drinks of alcohol   Drug use: No   Sexual activity: Not on file  Other Topics Concern   Not on file  Social History Narrative   Lives at home with his wife.   Right-handed.  2 cups caffeine per day.   Social Drivers of Corporate investment banker Strain: Not on file  Food Insecurity: Not on file  Transportation Needs: Not on file  Physical Activity: Not on file  Stress: Not on file  Social Connections: Not on file  Intimate Partner Violence: Not on file   PHYSICAL EXAM  There were no vitals filed for this visit.  There is no height or weight on file to calculate BMI.  Generalized: Well developed, in no acute distress     11/12/2021    1:12 PM  MMSE - Mini Mental State Exam  Orientation to time 4  Orientation to Place 5  Registration 3  Attention/ Calculation 5  Recall 2  Language- name 2 objects 2  Language- repeat 1  Language- follow 3 step command 3  Language- read & follow direction 1  Write a sentence 1  Copy design 1  Copy design-comments 4 legged animals x 1 min: 7  Total score 28   Neurological examination  Mentation: Alert, cooperative, very nice, wife provides history . Follows all commands speech, mild masking of the face. Cranial nerve II-XII: Pupils were equal round reactive to light. Extraocular movements were full, visual field were full on confrontational test. Facial sensation and strength were normal.  Head turning and shoulder shrug  were normal and symmetric. Motor: Good strength overall,  Mild bradykinesia, right > left, no tremor, increased rigidity to left upper extremity with reinforcement Sensory: Sensory testing is intact to soft touch on all 4 extremities. No evidence of extinction is noted.  Coordination: Cerebellar testing reveals good finger-nose-finger and heel-to-shin bilaterally.   Is slow. Gait and station:  push off from seated position, cautious, wide based, relies on rolling walker   DIAGNOSTIC DATA (LABS, IMAGING, TESTING) - I reviewed patient records, labs, notes, testing and imaging myself where available.  Lab Results  Component Value Date   WBC 4.7 12/25/2010   HGB 13.8 12/25/2010   HCT 40.5 12/25/2010   MCV 91.8 12/25/2010   PLT 232 12/25/2010      Component Value Date/Time   NA 138 12/25/2010 1347   K 3.9 12/25/2010 1347   CL 104 12/25/2010 1347   CO2 27 12/25/2010 1347   GLUCOSE 174 (H) 12/25/2010 1347   BUN 13 12/25/2010 1347   CREATININE 0.70 07/22/2023 1337   CALCIUM 9.5 12/25/2010 1347   PROT 6.8 04/01/2010 0900   ALBUMIN 3.9 04/01/2010 0900   AST 21 04/01/2010 0900   ALT 21 04/01/2010 0900   ALKPHOS 72 04/01/2010 0900   BILITOT 0.8 04/01/2010 0900   GFRNONAA >60 12/25/2010 1347   GFRAA  12/25/2010 1347    >60        The eGFR has been calculated using the MDRD equation. This calculation has not been validated in all clinical situations. eGFR's persistently <60 mL/min signify possible Chronic Kidney Disease.   No results found for: "CHOL", "HDL", "LDLCALC", "LDLDIRECT", "TRIG", "CHOLHDL" No results found for: "HGBA1C" Lab Results  Component Value Date   VITAMINB12 284 11/07/2019   Lab Results  Component Value Date   TSH 0.830 04/19/2023   Otila Kluver, DNP  [facility] [address] [place], [place] [address] [phone]

## 2023-12-02 ENCOUNTER — Ambulatory Visit: Payer: Medicare Other | Admitting: Neurology

## 2023-12-17 ENCOUNTER — Other Ambulatory Visit: Payer: Self-pay | Admitting: Internal Medicine

## 2024-01-17 DIAGNOSIS — R1310 Dysphagia, unspecified: Secondary | ICD-10-CM | POA: Diagnosis not present

## 2024-01-17 DIAGNOSIS — E1165 Type 2 diabetes mellitus with hyperglycemia: Secondary | ICD-10-CM | POA: Diagnosis not present

## 2024-01-17 DIAGNOSIS — I2510 Atherosclerotic heart disease of native coronary artery without angina pectoris: Secondary | ICD-10-CM | POA: Diagnosis not present

## 2024-01-17 DIAGNOSIS — K5900 Constipation, unspecified: Secondary | ICD-10-CM | POA: Diagnosis not present

## 2024-01-17 DIAGNOSIS — L729 Follicular cyst of the skin and subcutaneous tissue, unspecified: Secondary | ICD-10-CM | POA: Diagnosis not present

## 2024-01-17 DIAGNOSIS — H3530 Unspecified macular degeneration: Secondary | ICD-10-CM | POA: Diagnosis not present

## 2024-01-17 DIAGNOSIS — R269 Unspecified abnormalities of gait and mobility: Secondary | ICD-10-CM | POA: Diagnosis not present

## 2024-01-17 DIAGNOSIS — Z8673 Personal history of transient ischemic attack (TIA), and cerebral infarction without residual deficits: Secondary | ICD-10-CM | POA: Diagnosis not present

## 2024-01-17 DIAGNOSIS — E782 Mixed hyperlipidemia: Secondary | ICD-10-CM | POA: Diagnosis not present

## 2024-01-17 DIAGNOSIS — I10 Essential (primary) hypertension: Secondary | ICD-10-CM | POA: Diagnosis not present

## 2024-01-17 DIAGNOSIS — R600 Localized edema: Secondary | ICD-10-CM | POA: Diagnosis not present

## 2024-03-13 DIAGNOSIS — R069 Unspecified abnormalities of breathing: Secondary | ICD-10-CM | POA: Diagnosis not present

## 2024-03-13 DIAGNOSIS — R531 Weakness: Secondary | ICD-10-CM | POA: Diagnosis not present

## 2024-03-13 DIAGNOSIS — Z7984 Long term (current) use of oral hypoglycemic drugs: Secondary | ICD-10-CM | POA: Diagnosis not present

## 2024-03-13 DIAGNOSIS — R739 Hyperglycemia, unspecified: Secondary | ICD-10-CM | POA: Diagnosis not present

## 2024-03-13 DIAGNOSIS — E860 Dehydration: Secondary | ICD-10-CM | POA: Diagnosis not present

## 2024-03-13 DIAGNOSIS — R9431 Abnormal electrocardiogram [ECG] [EKG]: Secondary | ICD-10-CM | POA: Diagnosis not present

## 2024-03-13 DIAGNOSIS — Z7401 Bed confinement status: Secondary | ICD-10-CM | POA: Diagnosis not present

## 2024-03-13 DIAGNOSIS — Z87891 Personal history of nicotine dependence: Secondary | ICD-10-CM | POA: Diagnosis not present

## 2024-03-13 DIAGNOSIS — E1165 Type 2 diabetes mellitus with hyperglycemia: Secondary | ICD-10-CM | POA: Diagnosis not present

## 2024-03-13 DIAGNOSIS — I10 Essential (primary) hypertension: Secondary | ICD-10-CM | POA: Diagnosis not present

## 2024-03-13 DIAGNOSIS — R6889 Other general symptoms and signs: Secondary | ICD-10-CM | POA: Diagnosis not present

## 2024-03-13 DIAGNOSIS — Z7902 Long term (current) use of antithrombotics/antiplatelets: Secondary | ICD-10-CM | POA: Diagnosis not present

## 2024-03-16 DIAGNOSIS — K5909 Other constipation: Secondary | ICD-10-CM | POA: Diagnosis not present

## 2024-03-16 DIAGNOSIS — R600 Localized edema: Secondary | ICD-10-CM | POA: Diagnosis not present

## 2024-03-16 DIAGNOSIS — G20A1 Parkinson's disease without dyskinesia, without mention of fluctuations: Secondary | ICD-10-CM | POA: Diagnosis not present

## 2024-03-16 DIAGNOSIS — R634 Abnormal weight loss: Secondary | ICD-10-CM | POA: Diagnosis not present

## 2024-03-16 DIAGNOSIS — E1165 Type 2 diabetes mellitus with hyperglycemia: Secondary | ICD-10-CM | POA: Diagnosis not present

## 2024-03-16 DIAGNOSIS — H6123 Impacted cerumen, bilateral: Secondary | ICD-10-CM | POA: Diagnosis not present

## 2024-03-16 DIAGNOSIS — E782 Mixed hyperlipidemia: Secondary | ICD-10-CM | POA: Diagnosis not present

## 2024-03-16 DIAGNOSIS — I2510 Atherosclerotic heart disease of native coronary artery without angina pectoris: Secondary | ICD-10-CM | POA: Diagnosis not present

## 2024-03-16 DIAGNOSIS — R1310 Dysphagia, unspecified: Secondary | ICD-10-CM | POA: Diagnosis not present

## 2024-03-16 DIAGNOSIS — H3530 Unspecified macular degeneration: Secondary | ICD-10-CM | POA: Diagnosis not present

## 2024-03-16 DIAGNOSIS — I10 Essential (primary) hypertension: Secondary | ICD-10-CM | POA: Diagnosis not present

## 2024-03-27 ENCOUNTER — Emergency Department (HOSPITAL_COMMUNITY)
Admission: EM | Admit: 2024-03-27 | Discharge: 2024-04-21 | Disposition: E | Attending: Emergency Medicine | Admitting: Emergency Medicine

## 2024-03-27 ENCOUNTER — Other Ambulatory Visit (HOSPITAL_COMMUNITY)

## 2024-03-27 ENCOUNTER — Other Ambulatory Visit: Payer: Self-pay

## 2024-03-27 ENCOUNTER — Emergency Department (HOSPITAL_COMMUNITY)

## 2024-03-27 DIAGNOSIS — I499 Cardiac arrhythmia, unspecified: Secondary | ICD-10-CM | POA: Diagnosis not present

## 2024-03-27 DIAGNOSIS — G20C Parkinsonism, unspecified: Secondary | ICD-10-CM | POA: Insufficient documentation

## 2024-03-27 DIAGNOSIS — R7989 Other specified abnormal findings of blood chemistry: Secondary | ICD-10-CM | POA: Insufficient documentation

## 2024-03-27 DIAGNOSIS — R7402 Elevation of levels of lactic acid dehydrogenase (LDH): Secondary | ICD-10-CM | POA: Insufficient documentation

## 2024-03-27 DIAGNOSIS — I2609 Other pulmonary embolism with acute cor pulmonale: Secondary | ICD-10-CM | POA: Insufficient documentation

## 2024-03-27 DIAGNOSIS — R0682 Tachypnea, not elsewhere classified: Secondary | ICD-10-CM | POA: Diagnosis not present

## 2024-03-27 DIAGNOSIS — R6889 Other general symptoms and signs: Secondary | ICD-10-CM | POA: Diagnosis not present

## 2024-03-27 DIAGNOSIS — I2699 Other pulmonary embolism without acute cor pulmonale: Secondary | ICD-10-CM

## 2024-03-27 DIAGNOSIS — I517 Cardiomegaly: Secondary | ICD-10-CM | POA: Diagnosis not present

## 2024-03-27 DIAGNOSIS — Z743 Need for continuous supervision: Secondary | ICD-10-CM | POA: Diagnosis not present

## 2024-03-27 DIAGNOSIS — N2889 Other specified disorders of kidney and ureter: Secondary | ICD-10-CM | POA: Diagnosis not present

## 2024-03-27 DIAGNOSIS — Z8673 Personal history of transient ischemic attack (TIA), and cerebral infarction without residual deficits: Secondary | ICD-10-CM | POA: Insufficient documentation

## 2024-03-27 DIAGNOSIS — Z951 Presence of aortocoronary bypass graft: Secondary | ICD-10-CM | POA: Diagnosis not present

## 2024-03-27 DIAGNOSIS — N1330 Unspecified hydronephrosis: Secondary | ICD-10-CM | POA: Diagnosis not present

## 2024-03-27 DIAGNOSIS — R600 Localized edema: Secondary | ICD-10-CM | POA: Diagnosis not present

## 2024-03-27 DIAGNOSIS — I771 Stricture of artery: Secondary | ICD-10-CM | POA: Diagnosis not present

## 2024-03-27 DIAGNOSIS — N179 Acute kidney failure, unspecified: Secondary | ICD-10-CM

## 2024-03-27 DIAGNOSIS — R0602 Shortness of breath: Secondary | ICD-10-CM | POA: Diagnosis not present

## 2024-03-27 DIAGNOSIS — N320 Bladder-neck obstruction: Secondary | ICD-10-CM

## 2024-03-27 DIAGNOSIS — Z7902 Long term (current) use of antithrombotics/antiplatelets: Secondary | ICD-10-CM | POA: Diagnosis not present

## 2024-03-27 DIAGNOSIS — J90 Pleural effusion, not elsewhere classified: Secondary | ICD-10-CM | POA: Diagnosis not present

## 2024-03-27 DIAGNOSIS — I700 Atherosclerosis of aorta: Secondary | ICD-10-CM | POA: Diagnosis not present

## 2024-03-27 DIAGNOSIS — I469 Cardiac arrest, cause unspecified: Secondary | ICD-10-CM

## 2024-03-27 LAB — URINALYSIS, W/ REFLEX TO CULTURE (INFECTION SUSPECTED)
Bilirubin Urine: NEGATIVE
Glucose, UA: NEGATIVE mg/dL
Ketones, ur: NEGATIVE mg/dL
Leukocytes,Ua: NEGATIVE
Nitrite: NEGATIVE
Protein, ur: NEGATIVE mg/dL
Specific Gravity, Urine: 1.01 (ref 1.005–1.030)
pH: 5 (ref 5.0–8.0)

## 2024-03-27 LAB — CBC WITH DIFFERENTIAL/PLATELET
Abs Immature Granulocytes: 0.05 K/uL (ref 0.00–0.07)
Basophils Absolute: 0 K/uL (ref 0.0–0.1)
Basophils Relative: 0 %
Eosinophils Absolute: 0 K/uL (ref 0.0–0.5)
Eosinophils Relative: 0 %
HCT: 37.2 % — ABNORMAL LOW (ref 39.0–52.0)
Hemoglobin: 12.3 g/dL — ABNORMAL LOW (ref 13.0–17.0)
Immature Granulocytes: 1 %
Lymphocytes Relative: 4 %
Lymphs Abs: 0.5 K/uL — ABNORMAL LOW (ref 0.7–4.0)
MCH: 31.4 pg (ref 26.0–34.0)
MCHC: 33.1 g/dL (ref 30.0–36.0)
MCV: 94.9 fL (ref 80.0–100.0)
Monocytes Absolute: 0.9 K/uL (ref 0.1–1.0)
Monocytes Relative: 9 %
Neutro Abs: 9.2 K/uL — ABNORMAL HIGH (ref 1.7–7.7)
Neutrophils Relative %: 86 %
Platelets: 286 K/uL (ref 150–400)
RBC: 3.92 MIL/uL — ABNORMAL LOW (ref 4.22–5.81)
RDW: 14.6 % (ref 11.5–15.5)
WBC: 10.7 K/uL — ABNORMAL HIGH (ref 4.0–10.5)
nRBC: 0 % (ref 0.0–0.2)

## 2024-03-27 LAB — BASIC METABOLIC PANEL WITH GFR
Anion gap: 18 — ABNORMAL HIGH (ref 5–15)
BUN: 70 mg/dL — ABNORMAL HIGH (ref 8–23)
CO2: 11 mmol/L — ABNORMAL LOW (ref 22–32)
Calcium: 9.1 mg/dL (ref 8.9–10.3)
Chloride: 111 mmol/L (ref 98–111)
Creatinine, Ser: 2.22 mg/dL — ABNORMAL HIGH (ref 0.61–1.24)
GFR, Estimated: 29 mL/min — ABNORMAL LOW (ref >60)
Glucose, Bld: 198 mg/dL — ABNORMAL HIGH (ref 70–99)
Potassium: 4.1 mmol/L (ref 3.5–5.1)
Sodium: 140 mmol/L (ref 135–145)

## 2024-03-27 LAB — CK: Total CK: 38 U/L — ABNORMAL LOW (ref 49–397)

## 2024-03-27 LAB — TROPONIN I (HIGH SENSITIVITY)
Troponin I (High Sensitivity): 564 ng/L (ref <18)
Troponin I (High Sensitivity): 531 ng/L (ref <18)

## 2024-03-27 LAB — BRAIN NATRIURETIC PEPTIDE: B Natriuretic Peptide: 812 pg/mL — ABNORMAL HIGH (ref 0.0–100.0)

## 2024-03-27 LAB — MAGNESIUM: Magnesium: 2.5 mg/dL — ABNORMAL HIGH (ref 1.7–2.4)

## 2024-03-27 LAB — LACTIC ACID, PLASMA
Lactic Acid, Venous: 5.2 mmol/L (ref 0.5–1.9)
Lactic Acid, Venous: 6.8 mmol/L (ref 0.5–1.9)
Lactic Acid, Venous: >9 mmol/L (ref 0.5–1.9)

## 2024-03-27 LAB — RESP PANEL BY RT-PCR (RSV, FLU A&B, COVID)  RVPGX2
Influenza A by PCR: NEGATIVE
Influenza B by PCR: NEGATIVE
Resp Syncytial Virus by PCR: NEGATIVE
SARS Coronavirus 2 by RT PCR: NEGATIVE

## 2024-03-27 LAB — PROTIME-INR
INR: 1.3 — ABNORMAL HIGH (ref 0.8–1.2)
Prothrombin Time: 17 s — ABNORMAL HIGH (ref 11.4–15.2)

## 2024-03-27 MED ORDER — SODIUM CHLORIDE 0.9 % IV SOLN: 2.0000 g | Freq: Once | INTRAVENOUS | Status: DC

## 2024-03-27 MED ORDER — TECHNETIUM TO 99M ALBUMIN AGGREGATED
4.0000 | Freq: Once | INTRAVENOUS | Status: AC | PRN
  Administered 2024-03-27: 4.2 via INTRAVENOUS

## 2024-03-27 MED ORDER — VANCOMYCIN HCL 1500 MG/300ML IV SOLN
1500.0000 mg | Freq: Once | INTRAVENOUS | Status: AC
  Administered 2024-03-27: 1500 mg via INTRAVENOUS
  Filled 2024-03-27: qty 300

## 2024-03-27 MED ORDER — LACTATED RINGERS IV SOLN: INTRAVENOUS | Status: DC

## 2024-03-27 MED ORDER — METRONIDAZOLE 500 MG/100ML IV SOLN: 500.0000 mg | Freq: Once | INTRAVENOUS | Status: DC

## 2024-03-27 MED ORDER — HEPARIN BOLUS VIA INFUSION
3800.0000 [IU] | Freq: Once | INTRAVENOUS | Status: AC
  Administered 2024-03-27: 3800 [IU] via INTRAVENOUS

## 2024-03-27 MED ORDER — LACTATED RINGERS IV BOLUS
1000.0000 mL | Freq: Once | INTRAVENOUS | Status: AC
  Administered 2024-03-27: 1000 mL via INTRAVENOUS

## 2024-03-27 MED ORDER — HEPARIN (PORCINE) 25000 UT/250ML-% IV SOLN
1250.0000 [IU]/h | INTRAVENOUS | Status: DC
  Administered 2024-03-27: 1250 [IU]/h via INTRAVENOUS
  Filled 2024-03-27: qty 250

## 2024-03-27 MED ORDER — LACTATED RINGERS IV BOLUS (SEPSIS)
1300.0000 mL | Freq: Once | INTRAVENOUS | Status: AC
  Administered 2024-03-27: 1300 mL via INTRAVENOUS

## 2024-03-27 MED ORDER — VANCOMYCIN HCL IN DEXTROSE 1-5 GM/200ML-% IV SOLN: 1000.0000 mg | Freq: Once | INTRAVENOUS | Status: DC

## 2024-03-30 ENCOUNTER — Telehealth: Payer: Self-pay | Admitting: Neurology

## 2024-03-30 NOTE — Telephone Encounter (Signed)
 Pt's wife called wanting to inform Dr Onita that the pt has passed away on 2024-05-05. She appreciates everything that was done to help. All appts for the pt have been cx.

## 2024-04-01 LAB — CULTURE, BLOOD (ROUTINE X 2)
Culture: NO GROWTH
Culture: NO GROWTH

## 2024-04-06 NOTE — Telephone Encounter (Signed)
 Filling out sympathy card, staff will sign, and will be mailed to the pt family

## 2024-04-21 NOTE — ED Triage Notes (Signed)
 Pt arrived [facility] from home with c/o SOB for a week when he got out of bed and SOB x 2 days while in the bed. Pt also declining in PO intake. Family cares for pt at home. [facility] gave 1G Bolus NS and placed 20 G IV RAC. CBG 235 .

## 2024-04-21 NOTE — Sepsis Progress Note (Signed)
Code Sepsis Protocol being monitored by eLink. 

## 2024-04-21 NOTE — ED Notes (Signed)
 EDP at bedside during triage

## 2024-04-21 NOTE — Progress Notes (Signed)
 PHARMACY - ANTICOAGULATION CONSULT NOTE  Pharmacy Consult for heparin  Indication: pulmonary embolus  No Known Allergies  Patient Measurements: Height: 5' 9 (175.3 cm) Weight: 77.1 kg (170 lb) IBW/kg (Calculated) : 70.7 HEPARIN  DW (KG): 77.1  Vital Signs: Temp: 97.8 F (36.6 C) 03/29/2024 1158) Temp Source: Oral 03/29/24 1158) BP: 101/70 03/29/2024 1300) Pulse Rate: 72 29-Mar-2024 1300)  Labs: Recent Labs    March 29, 2024 1129  HGB 12.3*  HCT 37.2*  PLT 286  LABPROT 17.0*  INR 1.3*  CREATININE 2.22*  TROPONINIHS 564*    Estimated Creatinine Clearance: 25.2 mL/min (A) (by C-G formula based on SCr of 2.22 mg/dL (H)).   Medical History: Past Medical History:  Diagnosis Date   Abnormal gait    Arthritis    Diabetes mellitus without complication (HCC)    Fatigue    GERD (gastroesophageal reflux disease)    Hypertension    Macular degeneration    Myocardial infarction (HCC)    Parkinson disease (HCC)    Prostate cancer (HCC)    Stroke (HCC)    Tremor    Vitamin D deficiency     Medications:  (Not in a hospital admission)   Assessment: Pharmacy consulted to dose heparin  in patient with pulmonary embolism. Patient is not on anticoagulation   CBC WNL Trop 564  Goal of Therapy:  Heparin  level 0.3-0.7 units/ml Monitor platelets by anticoagulation protocol: Yes   Plan:  Give 3800 units bolus x 1 Start heparin  infusion at 1250 units/hr Check anti-Xa level in 8 hours and daily while on heparin  Continue to monitor H&H and platelets  Elspeth Sour, PharmD Clinical Pharmacist 29-Mar-2024 1:29 PM

## 2024-04-21 NOTE — ED Notes (Signed)
 Called lab to find results for lactic acid. They said they will check on it.

## 2024-04-21 NOTE — ED Provider Notes (Signed)
  Physical Exam  BP 102/67   Pulse 100   Temp 97.8 F (36.6 C) (Oral)   Resp (!) 32   Ht 5' 9 (1.753 m)   Wt 77.1 kg   SpO2 90%   BMI 25.10 kg/m   Physical Exam Past Medical History:  Diagnosis Date   Abnormal gait    Arthritis    Diabetes mellitus without complication (HCC)    Fatigue    GERD (gastroesophageal reflux disease)    Hypertension    Macular degeneration    Myocardial infarction (HCC)    Parkinson disease (HCC)    Prostate cancer (HCC)    Stroke (HCC)    Tremor    Vitamin D deficiency     Procedures  Procedures  ED Course / MDM    Medical Decision Making Amount and/or Complexity of Data Reviewed Labs: ordered. Radiology: ordered.  Risk Prescription drug management.   Received in signout.  Hypotension.  Urinary retention.  Shortness of breath.  Typically immobile at home.  Creatinine increased.  Troponin up. Started empirically on heparin .  Also given antibiotics.  VQ scan done and shows pulmonary embolism.  Has had hypotension at times and elevated lactic acid.  Will discuss with ICU for admission.  Discussed with radiologist about CT chest abdomen pelvis.  Had constipation but no other real causes.  Had hydronephrosis.  Had discussed with patient earlier.  Wants to be DNR but would want intubation.  However patient went into a wide-complex and lost pulses.  Did quickly defibrillate.  No return of pulses.  Had PEA that went to complete bradycardia and asystole.  Time of death 8.        Patsey Lot, MD April 01, 2024 [phone]

## 2024-04-21 NOTE — ED Notes (Signed)
 Dark urine noted- verbal order for CK.

## 2024-04-21 NOTE — ED Notes (Addendum)
 Family called staff into room. Pt was agonal breathing . EDP at bedside requested 1 shock at 1648. Wife at bedside with pt .  Time of Death 46

## 2024-04-21 NOTE — ED Provider Notes (Signed)
 [place] EMERGENCY DEPARTMENT AT [facility] Provider Note   CSN: [id number] Arrival date & time: Mar 31, 2024  1109     Patient presents with: Shortness of Breath   [name] is a 83 y.o. male.  He is brought in from home for low blood pressure and shortness of breath.  He said he has been short of breath for today although EMS said it has been going on for a few days.  He denies cough or fever.  No chest pain or abdominal pain.  Per EMS he is usually ambulatory but has been increasingly short of breath and weak and has not ambulated for a few days.  History of pulmonary fibrosis stroke parkinsonism CABG. Wife says not eating or drinking.    The history is provided by the patient and the EMS personnel.  Shortness of Breath Severity:  Moderate Onset quality:  Gradual Timing:  Constant Progression:  Worsening Chronicity:  New Relieved by:  Nothing Worsened by:  Activity Ineffective treatments:  Rest Associated symptoms: no abdominal pain, no chest pain, no cough, no fever, no sputum production and no wheezing        Prior to Admission medications   Medication Sig Start Date End Date Taking? Authorizing Provider  amLODipine-benazepril (LOTREL) 5-20 MG capsule Take 1 capsule by mouth daily. 12/10/16   [provider]  carbidopa -levodopa  (SINEMET  IR) 25-100 MG tablet Take 2 tablets by mouth 3 (three) times daily. 05/13/23   Gayland Lauraine PARAS, NP  cetirizine (ZYRTEC) 10 MG tablet Take 10 mg by mouth daily as needed for allergies.    [provider]  clopidogrel  (PLAVIX ) 75 MG tablet Take 1 tablet (75 mg total) by mouth daily. 05/13/23   Gayland Lauraine PARAS, NP  ezetimibe  (ZETIA ) 10 MG tablet TAKE 1 TABLET BY MOUTH DAILY 12/17/23   Mallipeddi, Vishnu P, MD  Lancets (UNILET MICRO-THIN 33G) MISC USE TO test TWICE DAILY 02/17/23   [provider]  Multiple Vitamins-Minerals (PRESERVISION AREDS 2+MULTI VIT) CAPS Take 1 tablet by mouth in the morning and at  bedtime.    [provider]  nitroGLYCERIN  (NITROSTAT ) 0.4 MG SL tablet DISSOLVE 1 TABLET UNDER THE TONGUE EVERY 5 MINUTES AS NEEDED FOR CHEST PAIN. DO NOT EXCEED A TOTAL OF 3 DOSES IN 15 MINUTES.    [provider]  [facility] ULTRA test strip  04/05/23   [provider]  simvastatin (ZOCOR) 10 MG tablet Take 10 mg by mouth daily.    [provider]    Allergies: Patient has no known allergies.    Review of Systems  Constitutional:  Negative for fever.  Respiratory:  Positive for shortness of breath. Negative for cough, sputum production and wheezing.   Cardiovascular:  Negative for chest pain.  Gastrointestinal:  Negative for abdominal pain.    Updated Vital Signs BP 116/65   Pulse 68   Temp 97.8 F (36.6 C) (Oral)   Resp (!) 25   Ht 5' 9 (1.753 m)   Wt 77.1 kg   SpO2 93%   BMI 25.10 kg/m   Physical Exam Vitals and nursing note reviewed.  Constitutional:      Appearance: He is well-developed.  HENT:     Head: Normocephalic and atraumatic.  Eyes:     Conjunctiva/sclera: Conjunctivae normal.  Cardiovascular:     Rate and Rhythm: Normal rate and regular rhythm.     Heart sounds: No murmur heard. Pulmonary:     Effort: Tachypnea and accessory muscle  usage present. No respiratory distress.     Breath sounds: Normal breath sounds.  Abdominal:     Palpations: Abdomen is soft.     Tenderness: There is no abdominal tenderness.  Musculoskeletal:     Cervical back: Neck supple.     Right lower leg: No tenderness. Edema present.     Left lower leg: No tenderness. Edema present.  Skin:    General: Skin is warm and dry.     Comments: He has some sacral purpleish discoloration with small wound.  Neurological:     General: No focal deficit present.     Mental Status: He is alert.     GCS: GCS eye subscore is 4. GCS verbal subscore is 5. GCS motor subscore is 6.     (all labs ordered are listed, but only abnormal results are  displayed) Labs Reviewed  BASIC METABOLIC PANEL WITH GFR - Abnormal; Notable for the following components:      Result Value   CO2 11 (*)    Glucose, Bld 198 (*)    BUN 70 (*)    Creatinine, Ser 2.22 (*)    GFR, Estimated 29 (*)    Anion gap 18 (*)    All other components within normal limits  BRAIN NATRIURETIC PEPTIDE - Abnormal; Notable for the following components:   B Natriuretic Peptide 812.0 (*)    All other components within normal limits  CBC WITH DIFFERENTIAL/PLATELET - Abnormal; Notable for the following components:   WBC 10.7 (*)    RBC 3.92 (*)    Hemoglobin 12.3 (*)    HCT 37.2 (*)    Neutro Abs 9.2 (*)    Lymphs Abs 0.5 (*)    All other components within normal limits  PROTIME-INR - Abnormal; Notable for the following components:   Prothrombin Time 17.0 (*)    INR 1.3 (*)    All other components within normal limits  MAGNESIUM - Abnormal; Notable for the following components:   Magnesium 2.5 (*)    All other components within normal limits  LACTIC ACID, PLASMA - Abnormal; Notable for the following components:   Lactic Acid, Venous 5.2 (*)    All other components within normal limits  LACTIC ACID, PLASMA - Abnormal; Notable for the following components:   Lactic Acid, Venous 6.8 (*)    All other components within normal limits  URINALYSIS, W/ REFLEX TO CULTURE (INFECTION SUSPECTED) - Abnormal; Notable for the following components:   Hgb urine dipstick MODERATE (*)    Bacteria, UA RARE (*)    All other components within normal limits  TROPONIN I (HIGH SENSITIVITY) - Abnormal; Notable for the following components:   Troponin I (High Sensitivity) 564 (*)    All other components within normal limits  TROPONIN I (HIGH SENSITIVITY) - Abnormal; Notable for the following components:   Troponin I (High Sensitivity) 531 (*)    All other components within normal limits  RESP PANEL BY RT-PCR (RSV, FLU A&B, COVID)  RVPGX2  CULTURE, BLOOD (ROUTINE X 2)  CULTURE, BLOOD  (ROUTINE X 2)  HEPARIN  LEVEL (UNFRACTIONATED)  LACTIC ACID, PLASMA  CK  LACTIC ACID, PLASMA    EKG: EKG Interpretation Date/Time:  Monday 04/20/2024 11:16:39 EDT Ventricular Rate:  105 PR Interval:  196 QRS Duration:  133 QT Interval:  352 QTC Calculation: 466 R Axis:   20  Text Interpretation: Sinus tachycardia Ventricular tachycardia, unsustained Nonspecific intraventricular conduction delay Anteroseptal infarct, age indeterminate Borderline repolarization abnormality new  t wave inverions anterior from prior 10/24 Confirmed by Towana Sharper [phone]) on 04/15/2024 11:21:36 AM  Radiology: NM Pulmonary Perfusion Result Date: 2024/04/15 CLINICAL DATA:  One-week history of shortness of breath EXAM: NUCLEAR MEDICINE PERFUSION LUNG SCAN TECHNIQUE: Perfusion images were obtained in multiple projections after intravenous injection of radiopharmaceutical. Ventilation scans intentionally deferred if perfusion scan and chest x-ray adequate for interpretation during COVID 19 epidemic. RADIOPHARMACEUTICALS:  4.2 mCi Tc-44m MAA IV COMPARISON:  Same day chest radiograph FINDINGS: Asymmetric distribution of radiotracer with multifocal bilateral perfusion defects, right-greater-than-left. IMPRESSION: Pulmonary embolism present. Electronically Signed   By: Limin  Xu M.D.   On: 2024/04/15 16:19   DG Chest Port 1 View Result Date: Apr 15, 2024 CLINICAL DATA:  sob EXAM: PORTABLE CHEST - 1 VIEW COMPARISON:  September 02, 2023 FINDINGS: Low lung volumes. Streaky left basilar atelectasis. No focal airspace consolidation, pleural effusion, or pneumothorax. Moderate cardiomegaly. Sternotomy wires and changes of prior CABG. Tortuous aorta with aortic atherosclerosis. No acute fracture or destructive lesions. Multilevel thoracic osteophytosis. Osteopenia. IMPRESSION: No acute cardiopulmonary abnormality. Electronically Signed   By: Rogelia Myers M.D.   On: 15-Apr-2024 12:00     .Critical Care  Performed by: Towana Sharper BROCKS, MD Authorized by: Towana Sharper BROCKS, MD   Critical care provider statement:    Critical care time (minutes):  45   Critical care time was exclusive of:  Separately billable procedures and treating other patients   Critical care was necessary to treat or prevent imminent or life-threatening deterioration of the following conditions:  Respiratory failure and sepsis   Critical care was time spent personally by me on the following activities:  Development of treatment plan with patient or surrogate, discussions with consultants, evaluation of patient's response to treatment, examination of patient, obtaining history from patient or surrogate, ordering and performing treatments and interventions, ordering and review of laboratory studies, ordering and review of radiographic studies, pulse oximetry, re-evaluation of patient's condition and review of old charts   I assumed direction of critical care for this patient from another provider in my specialty: no      Medications Ordered in the ED  heparin  ADULT infusion 100 units/mL (25000 units/250mL) (1,250 Units/hr Intravenous New Bag/Given Apr 15, 2024 1406)  lactated ringers  infusion ( Intravenous New Bag/Given 04/15/2024 1606)  ceFEPIme (MAXIPIME) 2 g in sodium chloride  0.9 % 100 mL IVPB (has no administration in time range)  metroNIDAZOLE  (FLAGYL ) IVPB 500 mg (has no administration in time range)  lactated ringers  bolus 1,000 mL (0 mLs Intravenous Stopped April 15, 2024 1411)  heparin  bolus via infusion 3,800 Units (3,800 Units Intravenous Bolus from Bag 15-Apr-2024 1406)  lactated ringers  bolus 1,300 mL (0 mLs Intravenous Stopped Apr 15, 2024 1605)  vancomycin  (VANCOREADY) IVPB 1500 mg/300 mL (1,500 mg Intravenous New Bag/Given 2024/04/15 1431)  technetium albumin  aggregated (MAA) injection solution 4 millicurie (4.2 millicuries Intravenous Contrast Given April 15, 2024 1515)                                    Medical Decision Making Amount and/or Complexity of Data  Reviewed Labs: ordered. Radiology: ordered.  Risk Prescription drug management.   This patient complains of shortness of breath weakness low blood pressure; this involves an extensive number of treatment Options and is a complaint that carries with it a high risk of complications and morbidity. The differential includes sepsis, shock, pneumonia, CHF, COPD, PE  I ordered, reviewed and interpreted labs, which included  CBC with normal white count stable low hemoglobin, chemistries with new low bicarb new elevated BUN and creatinine, lactic acid critically elevated, urinalysis with positive hemoglobin but no significant red blood cells, troponins elevated, BNP elevated, COVID and flu negative I ordered medication IV fluids IV antibiotics IV heparin  and reviewed PMP when indicated. I ordered imaging studies which included chest x-ray, VQ scan, CT chest and abdomen and I independently    visualized and interpreted imaging which showed positive PE.  CTs pending at time of signout Additional history obtained from EMS and patient's wife and daughter Previous records obtained and reviewed in epic including recent PCP and ED visits Cardiac monitoring reviewed, sinus rhythm Social determinants considered, depression Critical Interventions: Workup of sepsis and initiation of IV heparin  for possible PE versus ACS  After the interventions stated above, I reevaluated the patient and found patient still to be with increased work of breathing although mental status has remained intact Admission and further testing considered, his care is signed out to Dr. Patsey to follow-up on results of V/Q and CTs.  Will need admission to the hospital, possibly [facility] if no ICU beds or progressive beds available.  Family updated      Final diagnoses:  Acute pulmonary embolism, unspecified pulmonary embolism type, unspecified whether acute cor pulmonale present [facility])    ED Discharge Orders     None           Towana Ozell BROCKS, MD 03-28-24 [phone]

## 2024-04-21 NOTE — ED Notes (Signed)
 Patient transported to CT and [facility].

## 2024-04-21 DEATH — deceased

## 2024-04-27 ENCOUNTER — Ambulatory Visit: Admitting: Internal Medicine

## 2024-07-13 ENCOUNTER — Ambulatory Visit: Admitting: Neurology
